# Patient Record
Sex: Female | Born: 1985 | Race: Black or African American | Hispanic: No | Marital: Married | Smoking: Never smoker
Health system: Southern US, Community
[De-identification: ages and names within clinical notes are randomized; demographics above are authoritative.]

## PROBLEM LIST (undated history)

## (undated) DIAGNOSIS — G4700 Insomnia, unspecified: Secondary | ICD-10-CM

## (undated) DIAGNOSIS — I10 Essential (primary) hypertension: Secondary | ICD-10-CM

## (undated) DIAGNOSIS — K2970 Gastritis, unspecified, without bleeding: Secondary | ICD-10-CM

## (undated) DIAGNOSIS — M4800 Spinal stenosis, site unspecified: Secondary | ICD-10-CM

## (undated) DIAGNOSIS — K589 Irritable bowel syndrome without diarrhea: Secondary | ICD-10-CM

## (undated) DIAGNOSIS — F319 Bipolar disorder, unspecified: Secondary | ICD-10-CM

## (undated) DIAGNOSIS — Z309 Encounter for contraceptive management, unspecified: Secondary | ICD-10-CM

## (undated) DIAGNOSIS — J45909 Unspecified asthma, uncomplicated: Secondary | ICD-10-CM

## (undated) DIAGNOSIS — F419 Anxiety disorder, unspecified: Secondary | ICD-10-CM

## (undated) DIAGNOSIS — Z86711 Personal history of pulmonary embolism: Secondary | ICD-10-CM

## (undated) DIAGNOSIS — G4733 Obstructive sleep apnea (adult) (pediatric): Secondary | ICD-10-CM

## (undated) DIAGNOSIS — M1990 Unspecified osteoarthritis, unspecified site: Secondary | ICD-10-CM

## (undated) HISTORY — DX: Morbid (severe) obesity due to excess calories: E66.01

## (undated) HISTORY — PX: TOTAL HIP ARTHROPLASTY: SHX124

## (undated) HISTORY — PX: TOE OSTEOTOMY: SHX1071

## (undated) HISTORY — PX: WISDOM TOOTH EXTRACTION: SHX21

## (undated) HISTORY — PX: SPINAL FUSION: SHX223

## (undated) HISTORY — DX: Obstructive sleep apnea (adult) (pediatric): G47.33

## (undated) HISTORY — PX: OTHER SURGICAL HISTORY: SHX169

## (undated) HISTORY — PX: TUBAL LIGATION: SHX77

## (undated) HISTORY — DX: Spinal stenosis, site unspecified: M48.00

## (undated) HISTORY — DX: Unspecified osteoarthritis, unspecified site: M19.90

## (undated) HISTORY — DX: Personal history of pulmonary embolism: Z86.711

## (undated) HISTORY — DX: Encounter for contraceptive management, unspecified: Z30.9

## (undated) HISTORY — PX: HIP SURGERY: SHX245

## (undated) HISTORY — PX: TOE AMPUTATION: SHX809

---

## 2011-01-07 DIAGNOSIS — L709 Acne, unspecified: Secondary | ICD-10-CM | POA: Insufficient documentation

## 2011-05-06 DIAGNOSIS — M25519 Pain in unspecified shoulder: Secondary | ICD-10-CM | POA: Insufficient documentation

## 2011-08-11 DIAGNOSIS — G4700 Insomnia, unspecified: Secondary | ICD-10-CM | POA: Insufficient documentation

## 2011-08-11 DIAGNOSIS — Z79899 Other long term (current) drug therapy: Secondary | ICD-10-CM | POA: Insufficient documentation

## 2011-08-11 DIAGNOSIS — F419 Anxiety disorder, unspecified: Secondary | ICD-10-CM | POA: Insufficient documentation

## 2018-01-08 DIAGNOSIS — E559 Vitamin D deficiency, unspecified: Secondary | ICD-10-CM | POA: Insufficient documentation

## 2018-03-10 DIAGNOSIS — K580 Irritable bowel syndrome with diarrhea: Secondary | ICD-10-CM | POA: Insufficient documentation

## 2018-09-17 DIAGNOSIS — R490 Dysphonia: Secondary | ICD-10-CM | POA: Insufficient documentation

## 2019-01-08 DIAGNOSIS — I10 Essential (primary) hypertension: Secondary | ICD-10-CM | POA: Insufficient documentation

## 2019-01-08 DIAGNOSIS — F32A Depression, unspecified: Secondary | ICD-10-CM | POA: Insufficient documentation

## 2019-01-08 DIAGNOSIS — J45909 Unspecified asthma, uncomplicated: Secondary | ICD-10-CM | POA: Insufficient documentation

## 2019-01-08 DIAGNOSIS — M1990 Unspecified osteoarthritis, unspecified site: Secondary | ICD-10-CM | POA: Insufficient documentation

## 2019-08-15 ENCOUNTER — Emergency Department: Payer: BLUE CROSS/BLUE SHIELD

## 2019-08-15 ENCOUNTER — Other Ambulatory Visit: Payer: Self-pay

## 2019-08-15 DIAGNOSIS — R0789 Other chest pain: Secondary | ICD-10-CM | POA: Insufficient documentation

## 2019-08-15 DIAGNOSIS — J45909 Unspecified asthma, uncomplicated: Secondary | ICD-10-CM | POA: Diagnosis not present

## 2019-08-15 DIAGNOSIS — I10 Essential (primary) hypertension: Secondary | ICD-10-CM | POA: Diagnosis not present

## 2019-08-15 DIAGNOSIS — Z96649 Presence of unspecified artificial hip joint: Secondary | ICD-10-CM | POA: Diagnosis not present

## 2019-08-15 LAB — CBC
HCT: 33.5 % — ABNORMAL LOW (ref 36.0–46.0)
Hemoglobin: 11.2 g/dL — ABNORMAL LOW (ref 12.0–15.0)
MCH: 28.4 pg (ref 26.0–34.0)
MCHC: 33.4 g/dL (ref 30.0–36.0)
MCV: 84.8 fL (ref 80.0–100.0)
Platelets: 267 K/uL (ref 150–400)
RBC: 3.95 MIL/uL (ref 3.87–5.11)
RDW: 12.8 % (ref 11.5–15.5)
WBC: 6.9 K/uL (ref 4.0–10.5)
nRBC: 0 % (ref 0.0–0.2)

## 2019-08-15 LAB — BASIC METABOLIC PANEL WITH GFR
Anion gap: 9 (ref 5–15)
BUN: 9 mg/dL (ref 6–20)
CO2: 26 mmol/L (ref 22–32)
Calcium: 8.6 mg/dL — ABNORMAL LOW (ref 8.9–10.3)
Chloride: 104 mmol/L (ref 98–111)
Creatinine, Ser: 0.81 mg/dL (ref 0.44–1.00)
GFR calc Af Amer: >60 mL/min
GFR calc non Af Amer: >60 mL/min
Glucose, Bld: 100 mg/dL — ABNORMAL HIGH (ref 70–99)
Potassium: 3 mmol/L — ABNORMAL LOW (ref 3.5–5.1)
Sodium: 139 mmol/L (ref 135–145)

## 2019-08-15 LAB — TROPONIN I (HIGH SENSITIVITY): Troponin I (High Sensitivity): <2 ng/L

## 2019-08-15 NOTE — ED Triage Notes (Addendum)
Pt arrives to ED via POV from home with c/o CP x1hr. Pt reports CP is centralized with radiation into the left side of her chest and into her upper back. Pt reports (+) SHOB; no N/V. Pt denies diaphoresis, but acknowledges feeling "flushed". Pt denies previous cardiac h/x except for HTN. Pt is A&O, in NAD; RR even, regular, and unlabored.

## 2019-08-16 ENCOUNTER — Emergency Department: Payer: BLUE CROSS/BLUE SHIELD

## 2019-08-16 ENCOUNTER — Encounter: Payer: Self-pay | Admitting: Radiology

## 2019-08-16 ENCOUNTER — Other Ambulatory Visit: Payer: Self-pay

## 2019-08-16 ENCOUNTER — Emergency Department
Admission: EM | Admit: 2019-08-16 | Discharge: 2019-08-16 | Disposition: A | Discharge status: Home / Self Care | Payer: BLUE CROSS/BLUE SHIELD | Attending: Emergency Medicine | Admitting: Emergency Medicine

## 2019-08-16 DIAGNOSIS — R079 Chest pain, unspecified: Secondary | ICD-10-CM

## 2019-08-16 HISTORY — DX: Bipolar disorder, unspecified: F31.9

## 2019-08-16 HISTORY — DX: Essential (primary) hypertension: I10

## 2019-08-16 HISTORY — DX: Anxiety disorder, unspecified: F41.9

## 2019-08-16 HISTORY — DX: Gastritis, unspecified, without bleeding: K29.70

## 2019-08-16 HISTORY — DX: Irritable bowel syndrome, unspecified: K58.9

## 2019-08-16 HISTORY — DX: Unspecified asthma, uncomplicated: J45.909

## 2019-08-16 HISTORY — DX: Insomnia, unspecified: G47.00

## 2019-08-16 LAB — TROPONIN I (HIGH SENSITIVITY): Troponin I (High Sensitivity): 3 ng/L (ref <18)

## 2019-08-16 MED ORDER — ONDANSETRON HCL 4 MG/2ML IJ SOLN
4.0000 mg | Freq: Once | INTRAMUSCULAR | Status: AC
  Administered 2019-08-16: 4 mg via INTRAVENOUS
  Filled 2019-08-16: qty 2

## 2019-08-16 MED ORDER — FENTANYL CITRATE (PF) 100 MCG/2ML IJ SOLN
50.0000 ug | Freq: Once | INTRAMUSCULAR | Status: AC
  Administered 2019-08-16: 50 ug via INTRAVENOUS
  Filled 2019-08-16: qty 2

## 2019-08-16 MED ORDER — ASPIRIN 81 MG PO CHEW
324.0000 mg | CHEWABLE_TABLET | Freq: Once | ORAL | Status: AC
  Administered 2019-08-16: 324 mg via ORAL
  Filled 2019-08-16: qty 4

## 2019-08-16 MED ORDER — POTASSIUM CHLORIDE CRYS ER 20 MEQ PO TBCR
40.0000 meq | EXTENDED_RELEASE_TABLET | Freq: Once | ORAL | Status: AC
  Administered 2019-08-16: 40 meq via ORAL
  Filled 2019-08-16: qty 2

## 2019-08-16 MED ORDER — IOHEXOL 350 MG/ML SOLN
100.0000 mL | Freq: Once | INTRAVENOUS | Status: AC | PRN
  Administered 2019-08-16: 100 mL via INTRAVENOUS

## 2019-08-16 NOTE — Discharge Instructions (Signed)

## 2019-08-16 NOTE — ED Provider Notes (Signed)
[facility] Emergency Department Provider Note  ____________________________________________  Time seen: Approximately 1:39 AM  I have reviewed the triage vital signs and the nursing notes.   HISTORY  Chief Complaint Chest Pain   HPI Sharon Hill is a 34 y.o. female with a history of hypertension, elevated BMI, asthma, bipolar disorder, gastritis who presents for evaluation of chest pain.  Patient reports sudden onset of chest pain while driving home from work.  The pain started on the left side of her chest radiating down her left arm, up to her neck in to the left upper back.  She describes the pain as a pulling sensation which is currently 5 out of 10.  She also reports a flushing sensation associated with the onset of the pain.  She used her inhaler twice with no significant relief.  She denies wheezing or cough.  She reports mild shortness of breath associated with the chest pain.  The chest pain has been constant for several hours now.  She reports mild numbness to the left upper arm.  She is compliant with her medications.  She denies fever chills, cough, vomiting or diarrhea, abdominal pain.  No personal or family history of blood clots, no recent travel immobilization, no leg pain or swelling, no hemoptysis or exogenous hormones.   Past Medical History:  Diagnosis Date  . Anxiety   . Asthma   . Bipolar 1 disorder (HCC)   . Gastritis   . Hypertension   . IBS (irritable bowel syndrome)   . Insomnia     Past Surgical History:  Procedure Laterality Date  . left foot toe amputation    . TOTAL HIP ARTHROPLASTY      Prior to Admission medications   Not on File    Allergies Ambien [zolpidem], Morphine and related, Hydrocodone, and Wellbutrin [bupropion]  No family history on file.  Social History Social History   Tobacco Use  . Smoking status: Never Smoker  . Smokeless tobacco: Never Used  Substance Use Topics  . Alcohol use: Not on file    . Drug use: Not on file    Review of Systems  Constitutional: Negative for fever. Eyes: Negative for visual changes. ENT: Negative for sore throat. Neck: No neck pain  Cardiovascular: + chest pain. Respiratory: + shortness of breath. Gastrointestinal: Negative for abdominal pain, vomiting or diarrhea. Genitourinary: Negative for dysuria. Musculoskeletal: Negative for back pain. Skin: Negative for rash. Neurological: Negative for headaches, weakness or numbness. Psych: No SI or HI  ____________________________________________   PHYSICAL EXAM:  VITAL SIGNS: Vitals:   08/16/19 0133 08/16/19 0249  BP: (!) 142/88 (!) 152/82  Pulse: 71 73  Resp: 18 17  Temp:    SpO2: 100% 100%   Constitutional: Alert and oriented. Well appearing and in no apparent distress. HEENT:      Head: Normocephalic and atraumatic.         Eyes: Conjunctivae are normal. Sclera is non-icteric.       Mouth/Throat: Mucous membranes are moist.       Neck: Supple with no signs of meningismus. Cardiovascular: Regular rate and rhythm. No murmurs, gallops, or rubs. 2+ symmetrical distal pulses are present in all extremities. No JVD. Respiratory: Normal respiratory effort. Lungs are clear to auscultation bilaterally. No wheezes, crackles, or rhonchi.  Gastrointestinal: Soft, non tender, and non distended  Musculoskeletal: No edema, cyanosis, or erythema of extremities. Neurologic: Normal speech and language. Face is symmetric. Moving all extremities. No gross focal neurologic deficits  are appreciated. Skin: Skin is warm, dry and intact. No rash noted. Psychiatric: Mood and affect are normal. Speech and behavior are normal.  ____________________________________________   LABS (all labs ordered are listed, but only abnormal results are displayed)  Labs Reviewed  BASIC METABOLIC PANEL - Abnormal; Notable for the following components:      Result Value   Potassium 3.0 (*)    Glucose, Bld 100 (*)     Calcium 8.6 (*)    All other components within normal limits  CBC - Abnormal; Notable for the following components:   Hemoglobin 11.2 (*)    HCT 33.5 (*)    All other components within normal limits  TROPONIN I (HIGH SENSITIVITY)  TROPONIN I (HIGH SENSITIVITY)   ____________________________________________  EKG  ED ECG REPORT I, Rudene Re, the attending physician, personally viewed and interpreted this ECG.  Sinus rhythm, rate of 79, normal intervals, normal axis, no ST elevations or depressions.  Normal EKG.  01:42: Normal sinus rhythm, rate of 66, normal intervals, normal axis, no ST elevations or depressions.  Unchanged from initial EKG. ____________________________________________  RADIOLOGY  I have personally reviewed the images performed during this visit and I agree with the Radiologist's read.   Interpretation by Radiologist:  DG Chest 2 View  Result Date: 08/15/2019 CLINICAL DATA:  Pt arrives to ED via POV from home with c/o CP x1hr. Pt reports CP in centralized with radiation into the left side of her chest and into her upper back. EXAM: CHEST - 2 VIEW COMPARISON:  None. FINDINGS: Normal mediastinal contours. Heart size is upper normal. The lungs are clear. No pneumothorax or pleural effusion. No acute finding in the visualized skeleton. IMPRESSION: No acute cardiopulmonary finding. Electronically Signed   By: Audie Pinto M.D.   On: 08/15/2019 20:17   CT ANGIO CHEST AORTA W/CM & OR WO/CM  Result Date: 08/16/2019 CLINICAL DATA:  Left-sided chest pain radiating to the neck, back and left arm. EXAM: CT ANGIOGRAPHY CHEST WITH CONTRAST TECHNIQUE: Multidetector CT imaging of the chest was performed using the standard protocol during bolus administration of intravenous contrast. Multiplanar CT image reconstructions and MIPs were obtained to evaluate the vascular anatomy. CONTRAST:  125mL OMNIPAQUE IOHEXOL 350 MG/ML SOLN COMPARISON:  None. FINDINGS: Cardiovascular:  Satisfactory opacification of the pulmonary arteries to the segmental level. No evidence of pulmonary embolism. Normal heart size. No pericardial effusion. Mediastinum/Nodes: No enlarged mediastinal, hilar, or axillary lymph nodes. Thyroid gland, trachea, and esophagus demonstrate no significant findings. Lungs/Pleura: Lungs are clear. No pleural effusion or pneumothorax. Upper Abdomen: No acute abnormality. Musculoskeletal: No chest wall abnormality. No acute or significant osseous findings. Review of the MIP images confirms the above findings. IMPRESSION: No CT evidence of pulmonary embolism or other acute intrathoracic process. Electronically Signed   By: Virgina Norfolk M.D.   On: 08/16/2019 02:27     ____________________________________________   PROCEDURES  Procedure(s) performed:yes .1-3 Lead EKG Interpretation Performed by: Rudene Re, MD Authorized by: Rudene Re, MD     Interpretation: normal     ECG rate assessment: normal     Rhythm: sinus rhythm     Ectopy: none     Conduction: normal     Critical Care performed:  None ____________________________________________   INITIAL IMPRESSION / ASSESSMENT AND PLAN / ED COURSE  34 y.o. female with a history of hypertension, elevated BMI, asthma, bipolar disorder, gastritis who presents for evaluation of sudden onset of L sided chest pain associated with SOB and radiating to  the neck and upper back also associated with paresthesias of the L arm. Pain constant for several hours.   Patient initially with severely elevated BP of 193/81 which has improved with no intervention to 142/88, other vital signs are within normal limits, normal work of breathing, normal sats, she looks euvolemic, lungs are clear to auscultation, she has strong pulses in all 4 extremities, no neurological deficits.  Differential diagnoses including ACS versus dissection versus pneumothorax versus PE versus bronchoconstriction versus pneumonia  versus asthma exacerbation versus upper GI spasm versus GERD.  Review of old medical records has been done.  EKG showing no acute ischemic changes.  Second EKG showed no changes. HS-trop x 2 negative.  Chest x-ray visualized by me with normal mediastinum, no pulmonary edema, no pneumothorax, no pneumonia, confirmed by radiology.  No significant electrolyte derangements only mild hypokalemia which was supplemented orally, stable mild anemia. Will get CT angio of the chest. Will give Fentanyl for pain.  Patient placed on telemetry for close cardiorespiratory monitoring.  _________________________ 3:06 AM on 08/16/2019 -----------------------------------------  Reassessment: CTA visualized by me with no evidence of aortic dissection, PE or pneumonia, confirmed by radiology.  Patient symptoms improved.  She was given a full dose aspirin.  Most likely MSK in nature with symptoms lasting for several hours with 2 - EKGs, 2 - high-sensitivity troponin, and a negative CT angiogram.  Blood pressure has improved with no interventions.  Discussed close follow-up with her primary care doctor.  Discussed my standard return precautions for new or worsening chest pain, shortness of breath, fever, or abdominal pain.    _____________________________________________ Please note:  Patient was evaluated in Emergency Department today for the symptoms described in the history of present illness. Patient was evaluated in the context of the global COVID-19 pandemic, which necessitated consideration that the patient might be at risk for infection with the SARS-CoV-2 virus that causes COVID-19. Institutional protocols and algorithms that pertain to the evaluation of patients at risk for COVID-19 are in a state of rapid change based on information released by regulatory bodies including the CDC and federal and state organizations. These policies and algorithms were followed during the patient's care in the ED.  Some ED  evaluations and interventions may be delayed as a result of limited staffing during the pandemic.   [place] Controlled Substance Database was reviewed by me. ____________________________________________   FINAL CLINICAL IMPRESSION(S) / ED DIAGNOSES   Final diagnoses:  Chest pain, unspecified type      NEW MEDICATIONS STARTED DURING THIS VISIT:  ED Discharge Orders    None       Note:  This document was prepared using Dragon voice recognition software and may include unintentional dictation errors.    Don Perking, [place], MD 08/16/19 [phone]

## 2019-08-16 NOTE — ED Notes (Signed)
Pt transported to CT ?

## 2019-11-11 ENCOUNTER — Other Ambulatory Visit: Payer: Self-pay

## 2019-11-11 DIAGNOSIS — J45909 Unspecified asthma, uncomplicated: Secondary | ICD-10-CM | POA: Insufficient documentation

## 2019-11-11 DIAGNOSIS — Y999 Unspecified external cause status: Secondary | ICD-10-CM | POA: Insufficient documentation

## 2019-11-11 DIAGNOSIS — M542 Cervicalgia: Secondary | ICD-10-CM | POA: Insufficient documentation

## 2019-11-11 DIAGNOSIS — Z96643 Presence of artificial hip joint, bilateral: Secondary | ICD-10-CM | POA: Diagnosis not present

## 2019-11-11 DIAGNOSIS — I10 Essential (primary) hypertension: Secondary | ICD-10-CM | POA: Diagnosis not present

## 2019-11-11 DIAGNOSIS — Y93I9 Activity, other involving external motion: Secondary | ICD-10-CM | POA: Insufficient documentation

## 2019-11-11 DIAGNOSIS — Z041 Encounter for examination and observation following transport accident: Secondary | ICD-10-CM | POA: Diagnosis present
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
## 2019-11-12 ENCOUNTER — Emergency Department
Admission: EM | Admit: 2019-11-12 | Discharge: 2019-11-12 | Disposition: A | Discharge status: Home / Self Care | Payer: BC Managed Care – PPO | Attending: Emergency Medicine | Admitting: Emergency Medicine

## 2019-11-12 DIAGNOSIS — M542 Cervicalgia: Secondary | ICD-10-CM

## 2019-11-12 MED ORDER — LIDOCAINE 5 % EX PTCH
1.0000 | MEDICATED_PATCH | CUTANEOUS | Status: DC
  Administered 2019-11-12: 1 via TRANSDERMAL
  Filled 2019-11-12: qty 1

## 2019-11-12 MED ORDER — KETOROLAC TROMETHAMINE 30 MG/ML IJ SOLN
30.0000 mg | Freq: Once | INTRAMUSCULAR | Status: AC
  Administered 2019-11-12: 30 mg via INTRAMUSCULAR
  Filled 2019-11-12: qty 1

## 2019-11-12 MED ORDER — LIDOCAINE 5 % EX PTCH
1.0000 | MEDICATED_PATCH | Freq: Two times a day (BID) | CUTANEOUS | 0 refills | Status: AC
Start: 2019-11-12 — End: 2020-11-11
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
## 2019-11-12 NOTE — ED Notes (Signed)
Reviewed discharge instructions, follow-up care, and prescriptions with patient. Patient verbalized understanding of all information reviewed. Patient stable, with no distress noted at this time.    

## 2020-03-08 DIAGNOSIS — F3181 Bipolar II disorder: Secondary | ICD-10-CM | POA: Insufficient documentation

## 2020-03-23 DIAGNOSIS — R600 Localized edema: Secondary | ICD-10-CM | POA: Insufficient documentation

## 2020-03-23 DIAGNOSIS — D649 Anemia, unspecified: Secondary | ICD-10-CM | POA: Insufficient documentation

## 2020-03-23 DIAGNOSIS — J4540 Moderate persistent asthma, uncomplicated: Secondary | ICD-10-CM | POA: Insufficient documentation

## 2020-03-23 DIAGNOSIS — I2699 Other pulmonary embolism without acute cor pulmonale: Secondary | ICD-10-CM | POA: Insufficient documentation

## 2020-09-05 DIAGNOSIS — M25461 Effusion, right knee: Secondary | ICD-10-CM | POA: Insufficient documentation

## 2020-09-05 DIAGNOSIS — M1711 Unilateral primary osteoarthritis, right knee: Secondary | ICD-10-CM | POA: Insufficient documentation

## 2021-01-08 DIAGNOSIS — F4481 Dissociative identity disorder: Secondary | ICD-10-CM | POA: Insufficient documentation

## 2021-06-25 IMAGING — CT CT ANGIO CHEST
4 of 7 series · 19 of 46 positions shown · IV contrast (omnipaque)
Comparison: None.

CLINICAL DATA: Left-sided chest pain radiating to the neck, back
and left arm.

EXAM:
CT ANGIOGRAPHY CHEST WITH CONTRAST
TECHNIQUE: Multidetector CT imaging of the chest was performed using the
standard protocol during bolus administration of intravenous
contrast. Multiplanar CT image reconstructions and MIPs were
obtained to evaluate the vascular anatomy.
CONTRAST:  100mL OMNIPAQUE IOHEXOL 350 MG/ML SOLN

[Series 4: axial arterial · axial · arterial · 0.76mm/px · z∈[-471,-246]mm · 9 of 95 slices shown]
[im 10/95  lung]
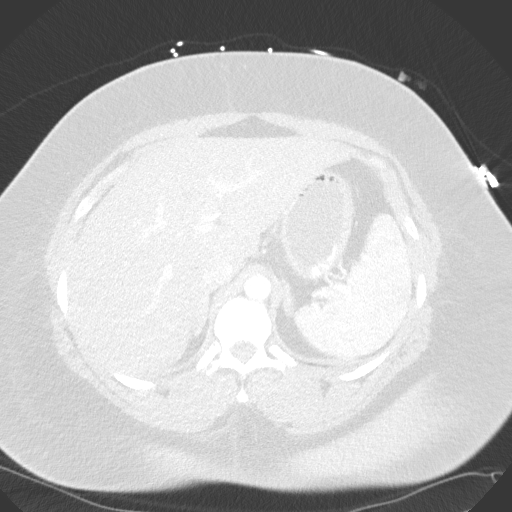
[im 19/95  soft-tissue]
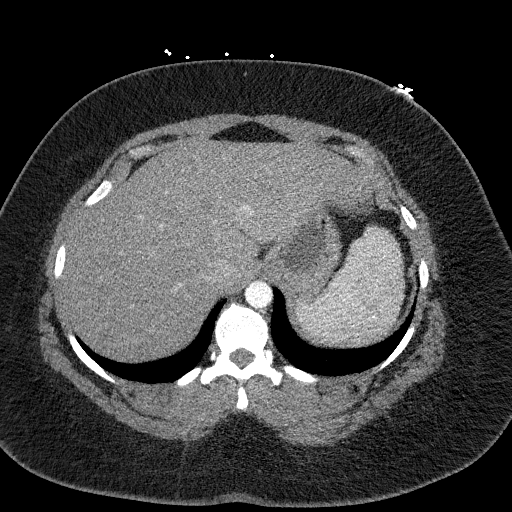
[im 29/95  lung]
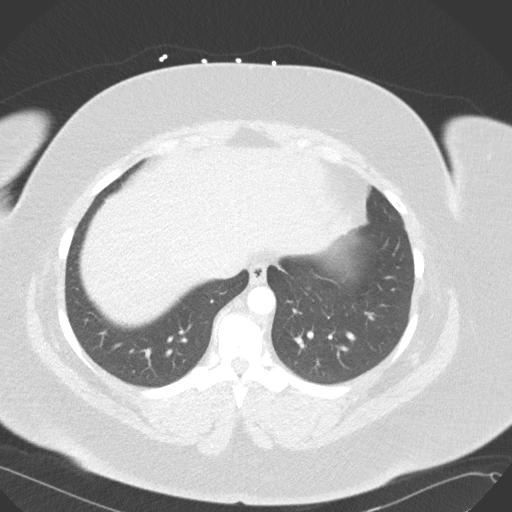
[im 38/95  soft-tissue]
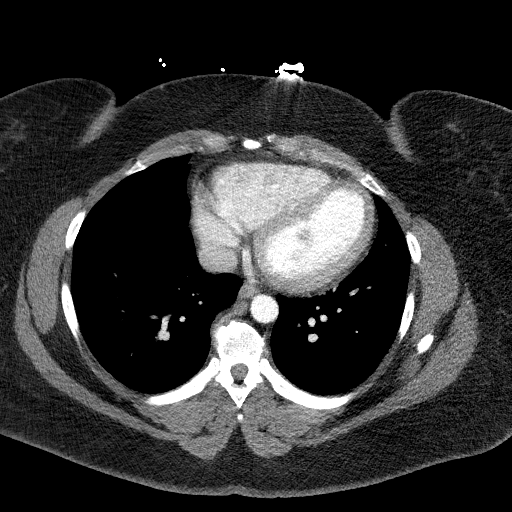
[im 48/95  lung]
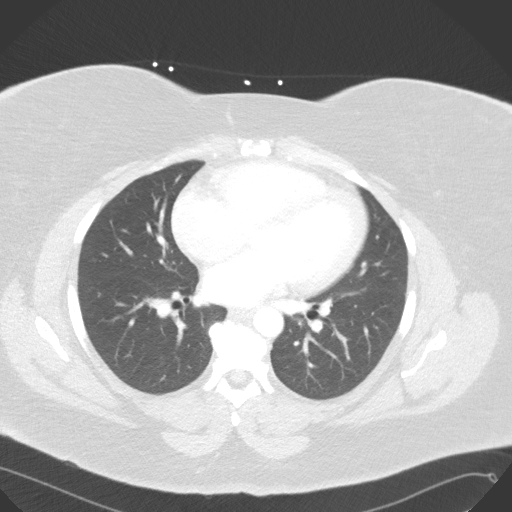
[im 57/95  soft-tissue]
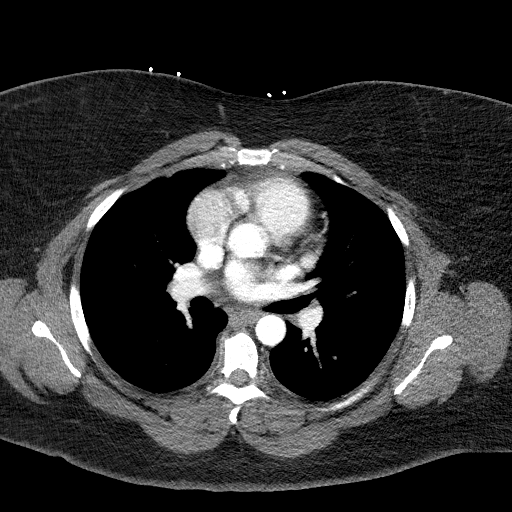
[im 66/95  lung]
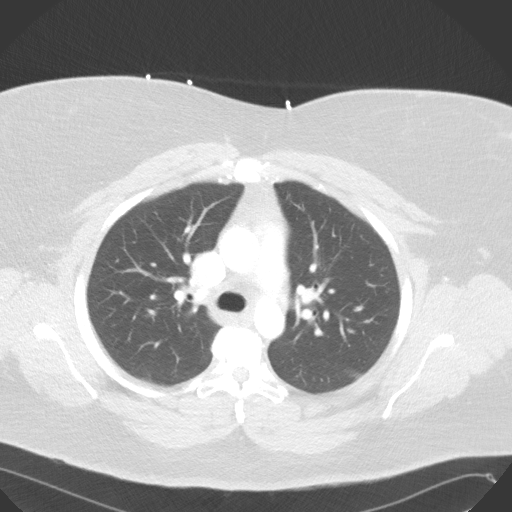
[im 76/95  soft-tissue]
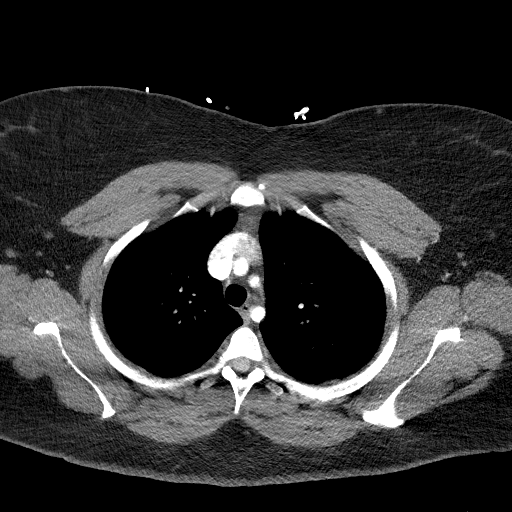
[im 85/95  lung]
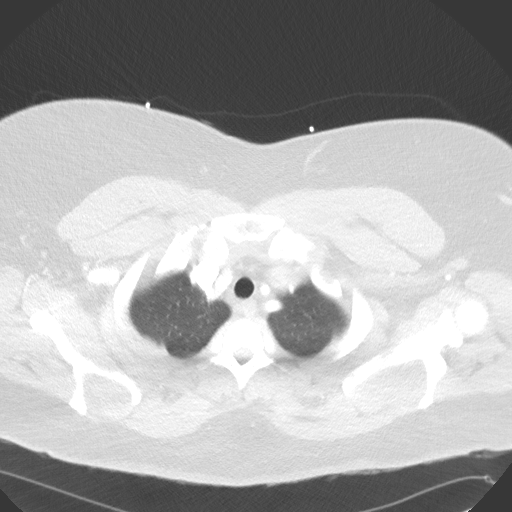

[Series 5: lung · axial · 0.76mm/px · z∈[-478,-398]mm · 3 of 142 slices shown]
[im 11/142  soft-tissue]
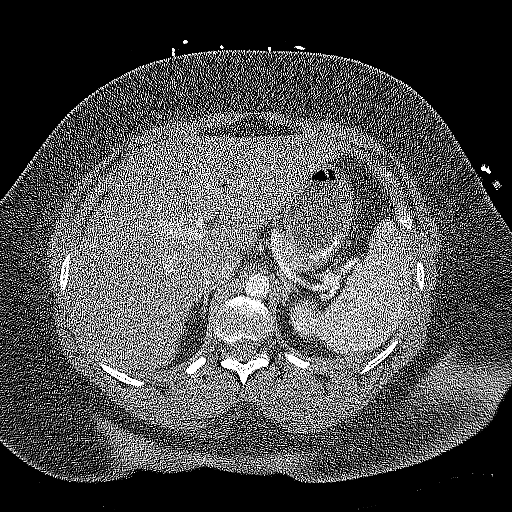
[im 31/142  soft-tissue]
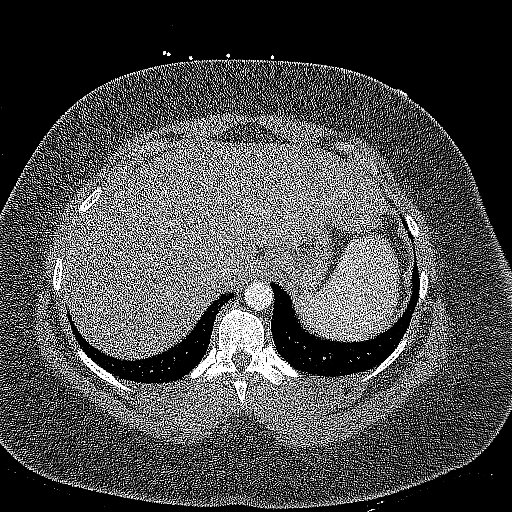
[im 51/142  soft-tissue]
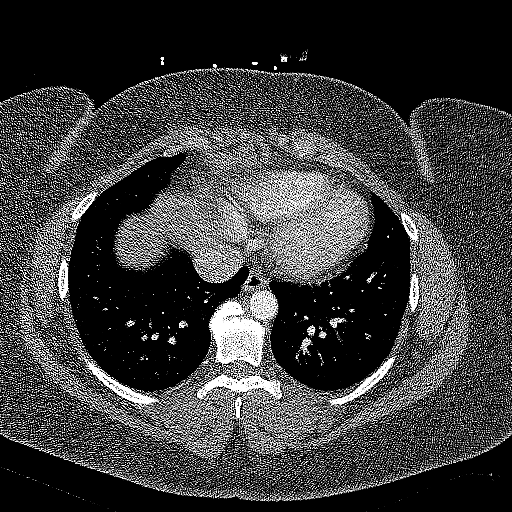

[Series 6: coronals · coronal · 0.60mm/px · 3 of 153 slices shown]
[im 39/153  soft-tissue]
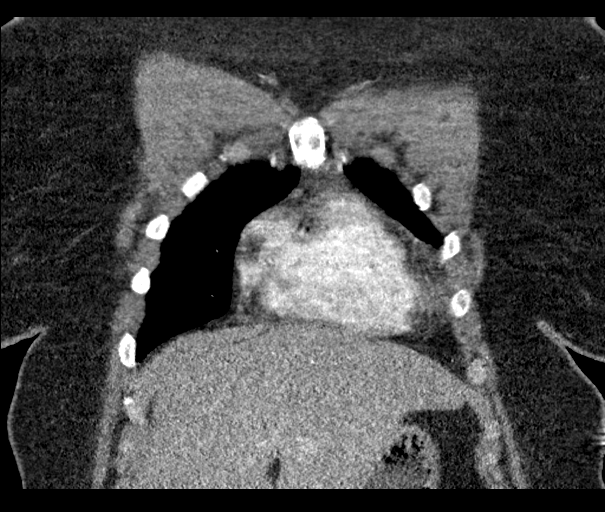
[im 77/153  soft-tissue]
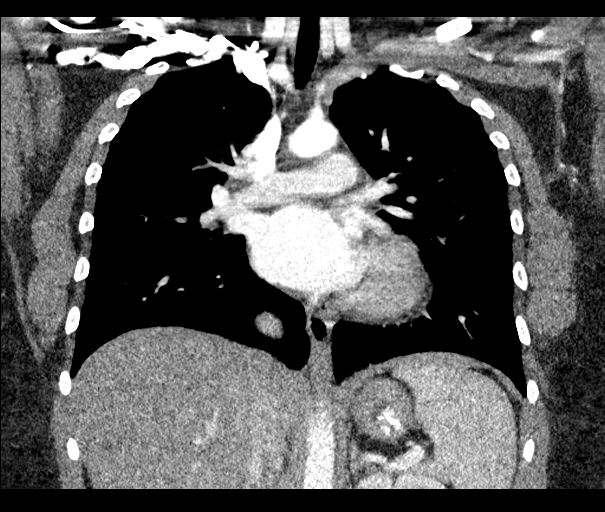
[im 115/153  soft-tissue]
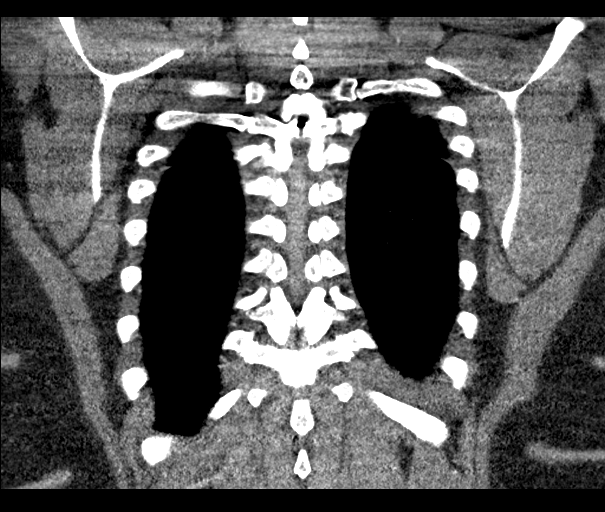

[Series 11: axial pre · axial · non-contrast · 0.75mm/px · z∈[-443,-273]mm · 4 of 58 slices shown]
[im 12/58  lung]
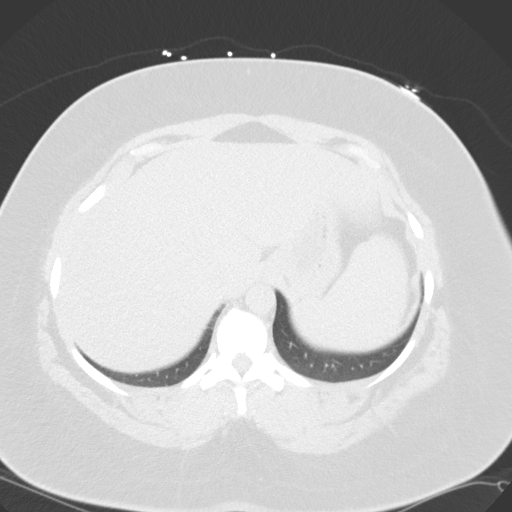
[im 23/58  lung]
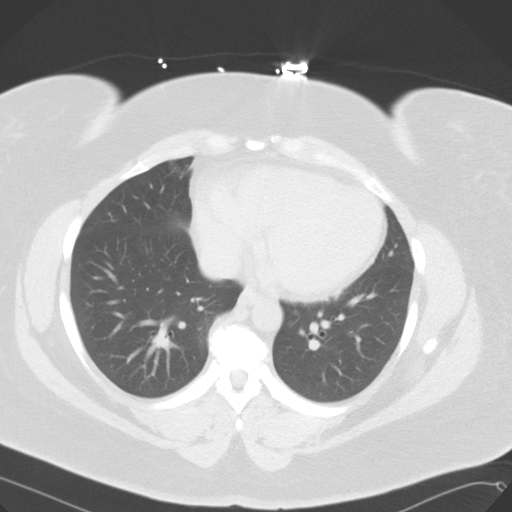
[im 35/58  lung]
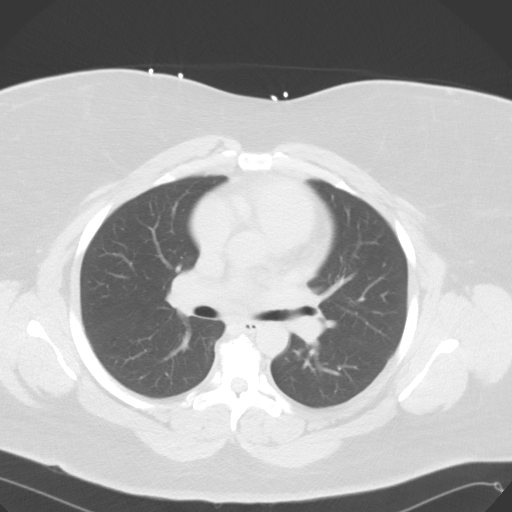
[im 46/58  lung]
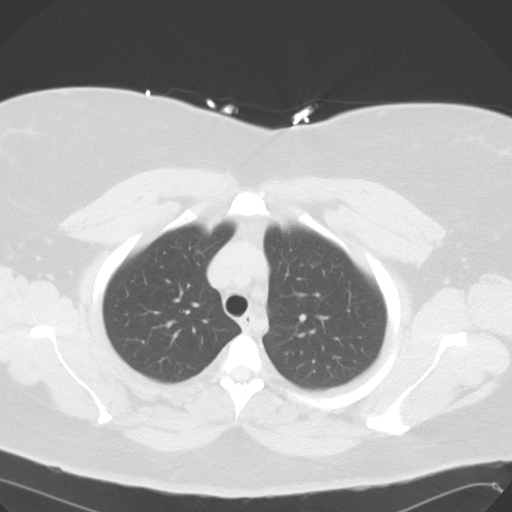

[19 of 46 positions shown; findings below may reference images not displayed]

FINDINGS: Cardiovascular: Satisfactory opacification of the pulmonary arteries
to the segmental level. No evidence of pulmonary embolism. Normal
heart size. No pericardial effusion.

Mediastinum/Nodes: No enlarged mediastinal, hilar, or axillary lymph
nodes. Thyroid gland, trachea, and esophagus demonstrate no
significant findings.

Lungs/Pleura: Lungs are clear. No pleural effusion or pneumothorax.

Upper Abdomen: No acute abnormality.

Musculoskeletal: No chest wall abnormality. No acute or significant
osseous findings.

Review of the MIP images confirms the above findings.
IMPRESSION: No CT evidence of pulmonary embolism or other acute intrathoracic
process.

## 2021-09-06 DIAGNOSIS — R42 Dizziness and giddiness: Secondary | ICD-10-CM | POA: Insufficient documentation

## 2021-09-06 DIAGNOSIS — G4733 Obstructive sleep apnea (adult) (pediatric): Secondary | ICD-10-CM | POA: Insufficient documentation

## 2021-09-06 DIAGNOSIS — F319 Bipolar disorder, unspecified: Secondary | ICD-10-CM | POA: Insufficient documentation

## 2021-09-15 DIAGNOSIS — G935 Compression of brain: Secondary | ICD-10-CM | POA: Insufficient documentation

## 2022-02-02 DIAGNOSIS — K591 Functional diarrhea: Secondary | ICD-10-CM | POA: Insufficient documentation

## 2022-02-02 DIAGNOSIS — K219 Gastro-esophageal reflux disease without esophagitis: Secondary | ICD-10-CM | POA: Insufficient documentation

## 2022-08-13 DIAGNOSIS — F4310 Post-traumatic stress disorder, unspecified: Secondary | ICD-10-CM | POA: Insufficient documentation

## 2022-10-02 DIAGNOSIS — R454 Irritability and anger: Secondary | ICD-10-CM | POA: Insufficient documentation

## 2022-11-13 DIAGNOSIS — Z87898 Personal history of other specified conditions: Secondary | ICD-10-CM | POA: Insufficient documentation

## 2022-11-24 ENCOUNTER — Inpatient Hospital Stay
Admission: RE | Admit: 2022-11-24 | Discharge: 2022-11-24 | Disposition: A | Discharge status: Home / Self Care | Payer: Self-pay | Source: Ambulatory Visit | Attending: Neurosurgery | Admitting: Neurosurgery

## 2022-11-24 ENCOUNTER — Other Ambulatory Visit: Payer: Self-pay

## 2022-11-24 ENCOUNTER — Telehealth: Payer: Self-pay | Admitting: Neurosurgery

## 2022-11-24 DIAGNOSIS — Z049 Encounter for examination and observation for unspecified reason: Secondary | ICD-10-CM

## 2022-11-24 NOTE — Telephone Encounter (Signed)
[facility] sent to [facility]  11/13/22: MRI lumbar  Can ask Dr Katrinka Blazing to review once we receive the lumbar MRI.

## 2022-11-24 NOTE — Telephone Encounter (Signed)
Pt called in wanting a 2nd opinion. She is a current pt at [facility]. Pt states that her doctors there all agree she needs surgery but will not operate due to her size (last recorded BMI that I could see 53.31)  She is calling in to see if any of our providers would be willing to take her on as a pt. She knows she needs to lose weight but is in immense pain and says that in the mean time while she is losing weight she does not want to have to continue taking pain medication. Says she is aware of the risks and complications but just doesn't want to be hooked on pain medication while trying to lose the weight to get her surgery.  I told the patient I would send this message back for our provider's to review and we would get back to her with an answer at our earliest convenience.

## 2022-11-25 NOTE — Telephone Encounter (Signed)
Per Dr. Katrinka Blazing he is Happy to see her, with her strength being intact and stable it is very unlikely that we will offer surgery without her losing weight first.  As long as she understands that we will probably have a similar discussion I do not want her to have to drive all the way into here that if she is not expecting it.  Patient notified and I informed her that we are happy to set up an appointment to establish care if she would like. Patient voiced understanding and will call back if she decides to schedule.

## 2023-03-26 ENCOUNTER — Ambulatory Visit: Payer: Medicaid Other | Admitting: Family Medicine

## 2023-05-15 DIAGNOSIS — M4317 Spondylolisthesis, lumbosacral region: Secondary | ICD-10-CM | POA: Insufficient documentation

## 2023-10-23 ENCOUNTER — Ambulatory Visit (INDEPENDENT_AMBULATORY_CARE_PROVIDER_SITE_OTHER)
Admit: 2023-10-23 | Discharge: 2023-10-23 | Disposition: A | Discharge status: Home / Self Care | Attending: Family Medicine | Admitting: Family Medicine

## 2023-10-23 ENCOUNTER — Other Ambulatory Visit (HOSPITAL_BASED_OUTPATIENT_CLINIC_OR_DEPARTMENT_OTHER): Admitting: Radiology

## 2023-10-23 ENCOUNTER — Ambulatory Visit (HOSPITAL_BASED_OUTPATIENT_CLINIC_OR_DEPARTMENT_OTHER): Admission: EM | Admit: 2023-10-23 | Discharge: 2023-10-23 | Disposition: A | Discharge status: Home / Self Care

## 2023-10-23 ENCOUNTER — Encounter (HOSPITAL_BASED_OUTPATIENT_CLINIC_OR_DEPARTMENT_OTHER): Payer: Self-pay

## 2023-10-23 DIAGNOSIS — M25571 Pain in right ankle and joints of right foot: Secondary | ICD-10-CM | POA: Diagnosis not present

## 2023-10-23 DIAGNOSIS — S98112A Complete traumatic amputation of left great toe, initial encounter: Secondary | ICD-10-CM

## 2023-10-23 DIAGNOSIS — M25471 Effusion, right ankle: Secondary | ICD-10-CM

## 2023-10-23 MED ORDER — PREDNISONE 20 MG PO TABS
20.0000 mg | ORAL_TABLET | Freq: Every day | ORAL | 0 refills | Status: AC
Start: 2023-10-23 — End: 2023-10-28

## 2023-10-23 NOTE — ED Provider Notes (Addendum)
 [facility]    CSN: [id number] Arrival date & time: 10/23/23  0853      History   Chief Complaint Chief Complaint  Patient presents with   Ankle Pain    HPI Sharon Hill is a 38 y.o. female.   Patient has had right ankle pain medially since approximately 10/17/2023.  She denies any injury.  She does report that she has prediabetes but has never had an episode of gout and is not sure what that is.  She has swelling and pain at the right medial ankle and it hurts to walk on the ankle.  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   Ankle Pain Associated symptoms: no back pain and no fever     Past Medical History:  Diagnosis Date   Anxiety    Asthma    Bipolar 1 disorder (HCC)    Gastritis    Hypertension    IBS (irritable bowel syndrome)    Insomnia     There are no active problems to display for this patient.   Past Surgical History:  Procedure Laterality Date   left foot toe amputation     TOTAL HIP ARTHROPLASTY      OB History   No obstetric history on file.      Home Medications    Prior to Admission medications   Medication Sig Start Date End Date Taking? Authorizing Provider  albuterol (VENTOLIN HFA) 108 (90 Base) MCG/ACT inhaler Inhale 2 puffs into the lungs every 4 (four) hours as needed for wheezing. 05/24/19  Yes [provider]  carvedilol (COREG) 12.5 MG tablet Take 12.5 mg by mouth 2 (two) times daily with a meal. 03/13/21  Yes [provider]  Fluticasone-Umeclidin-Vilant 100-62.5-25 MCG/ACT AEPB Inhale 1 puff into the lungs daily. 09/01/19  Yes [provider]  gabapentin (NEURONTIN) 300 MG capsule Take 300 mg by mouth 2 (two) times daily. 09/15/18  Yes [provider]  lithium 600 MG capsule Take 600 mg by mouth 2 (two) times daily with a meal. 08/29/21  Yes [provider]  montelukast (SINGULAIR) 10 MG  tablet Take 10 mg by mouth at bedtime. 09/12/18  Yes [provider]  predniSONE  (DELTASONE ) 20 MG tablet Take 1 tablet (20 mg total) by mouth daily with breakfast for 5 days. 10/23/23 10/28/23 Yes Ival Domino, FNP  QUEtiapine (SEROQUEL) 25 MG tablet Take 25 mg by mouth daily. 03/20/23 12/15/23 Yes [provider]  hyoscyamine (LEVSIN SL) 0.125 MG SL tablet Place 0.125 mg under the tongue every 6 (six) hours as needed for cramping.    [provider]  QUEtiapine (SEROQUEL) 50 MG tablet Take 50 mg by mouth daily.    [provider]    Family History History reviewed. No pertinent family history.  Social History Social History   Tobacco Use   Smoking status: Never   Smokeless tobacco: Never  Vaping Use   Vaping status: Never Used  Substance Use Topics   Alcohol use: Never   Drug use: Never     Allergies   Ambien [zolpidem], Morphine and codeine, Hydrocodone, and Wellbutrin [bupropion]   Review of Systems Review of Systems  Constitutional:  Negative for fever.  Respiratory:  Negative for cough.   Cardiovascular:  Negative for chest pain.  Gastrointestinal:  Negative for abdominal pain, constipation, diarrhea, nausea and vomiting.  Musculoskeletal:  Positive for joint swelling (right ankle pain). Negative for arthralgias and back pain.  Skin:  Negative for color change and rash.  Neurological:  Negative for syncope.  All other systems reviewed and are negative.    Physical Exam Triage Vital Signs ED Triage Vitals  Encounter Vitals Group     BP 10/23/23 0918 (!) 169/118     Girls Systolic BP Percentile --      Girls Diastolic BP Percentile --      Boys Systolic BP Percentile --      Boys Diastolic BP Percentile --      Pulse Rate 10/23/23 0918 (!) 56     Resp --      Temp 10/23/23 0918 97.8 F (36.6 C)     Temp Source 10/23/23 0918 Oral     SpO2 10/23/23 0918 98 %     Weight --      Height --      Head Circumference --      Peak  Flow --      Pain Score 10/23/23 0920 8     Pain Loc --      Pain Education --      Exclude from Growth Chart --    No data found.  Updated Vital Signs BP (!) 169/118 (BP Location: Right Arm)   Pulse (!) 56   Temp 97.8 F (36.6 C) (Oral)   LMP 10/14/2023   SpO2 98%   Visual Acuity Right Eye Distance:   Left Eye Distance:   Bilateral Distance:    Right Eye Near:   Left Eye Near:    Bilateral Near:     Physical Exam Vitals and nursing note reviewed.  Constitutional:      General: She is not in acute distress.    Appearance: She is well-developed. She is obese. She is not ill-appearing or toxic-appearing.  HENT:     Head: Normocephalic and atraumatic.     Right Ear: External ear normal.     Left Ear: External ear normal.     Nose: Nose normal.     Mouth/Throat:     Lips: Pink.     Mouth: Mucous membranes are moist.  Eyes:     Conjunctiva/sclera: Conjunctivae normal.     Pupils: Pupils are equal, round, and reactive to light.  Cardiovascular:     Rate and Rhythm: Normal rate and regular rhythm.     Pulses:          Dorsalis pedis pulses are 2+ on the right side and 2+ on the left side.       Posterior tibial pulses are 2+ on the right side and 2+ on the left side.     Heart sounds: S1 normal and S2 normal. No murmur heard. Pulmonary:     Effort: Pulmonary effort is normal. No respiratory distress.     Breath sounds: Normal breath sounds. No decreased breath sounds, wheezing, rhonchi or rales.  Musculoskeletal:        General: No swelling.     Right lower leg: Swelling present. 2+ Edema present.     Left lower leg: Swelling present. 2+ Edema present.     Right ankle: Swelling present. Tenderness present over the medial malleolus. Decreased range of motion (due to pain).     Right Achilles Tendon: Normal.     Left ankle: Normal.     Left Achilles Tendon: Normal.     Left Lower Extremity: Left leg is amputated below ankle.  Feet:     Comments: Left great toe  amputated when the patient was 38 years old. Skin:    General: Skin is warm and dry.     Capillary Refill: Capillary refill takes less than 2 seconds.     Findings: No rash.  Neurological:     Mental Status: She is alert and oriented to person, place, and time.  Psychiatric:        Mood and Affect: Mood normal.           UC Treatments / Results  Labs (all labs ordered are listed, but only abnormal results are displayed) Labs Reviewed  CBC WITH DIFFERENTIAL/PLATELET  COMPREHENSIVE METABOLIC PANEL WITH GFR  URIC ACID    EKG   Radiology DG Ankle Complete Right Result Date: 10/23/2023 CLINICAL DATA:  RIGHT ankle pain EXAM: RIGHT ANKLE - COMPLETE 3+ VIEW COMPARISON:  None Available. FINDINGS: Ankle mortise intact. The talar dome is normal. No malleolar fracture. The calcaneus is normal. Soft tissue swelling over the medial and lateral malleolus. IMPRESSION: 1. No fracture or dislocation. 2. Soft tissue swelling. Electronically Signed   By: Jackquline Boxer M.D.   On: 10/23/2023 10:27    Procedures Procedures (including critical care time)  Medications Ordered in UC Medications - No data to display  Initial Impression / Assessment and Plan / UC Course  I have reviewed the triage vital signs and the nursing notes.  Pertinent labs & imaging results that were available during my care of the patient were reviewed by me and considered in my medical decision making (see chart for details).  Plan of Care: Right ankle pain: X-rays are negative.  Prednisone  20 mg daily for 5 days.  Blood work is pending to rule in or out gout.  Work excuse provided.  Provided handouts on gout and encouraged to follow low purine diet.  At the end of the visit, after the AVS had printed, determined that an Ace wrap might be additional support for the right ankle.  Ace wrap applied.  Will adjust the plan of care, as needed once the labs result and based on patient's condition.  Follow-up as  needed.  I reviewed the plan of care with the patient and/or the patient's guardian.  The patient and/or guardian had time to ask questions and acknowledged that the questions were answered.  I provided instruction on symptoms or reasons to return here or to go to an ER, if symptoms/condition did not improve, worsened or if new symptoms occurred.  Final Clinical Impressions(s) / UC Diagnoses   Final diagnoses:  Pain and swelling of right ankle  Amputation of left great toe [facility])     Discharge Instructions      Right ankle pain and swelling: This is possibly gout.  Blood work is pending and will adjust the plan of care, if needed once the labs result.  X-ray was normal or negative.  Prednisone  20 mg 1 pill daily for 5 days.  Encouraged RICE therapy.  Follow-up if symptoms do not improve, worsen or new symptoms occur.     ED Prescriptions     Medication Sig Dispense Auth. Provider   predniSONE  (DELTASONE ) 20 MG tablet Take 1 tablet (20 mg total) by mouth daily with breakfast for 5 days. 5 tablet Cimone Fahey, FNP      PDMP not reviewed this encounter.   Ival Domino, FNP 10/23/23 1044    Ival Domino, FNP 10/23/23 1049

## 2023-10-23 NOTE — Discharge Instructions (Addendum)
 Right ankle pain and swelling: This is possibly gout.  Blood work is pending and will adjust the plan of care, if needed once the labs result.  X-ray was normal or negative.  Prednisone  20 mg 1 pill daily for 5 days.  Encouraged RICE therapy.  Follow-up if symptoms do not improve, worsen or new symptoms occur.

## 2023-10-23 NOTE — ED Triage Notes (Signed)
 Right ankle pain x 1 week without injury. States discomfort to medial aspect of right ankle with swelling.

## 2023-10-25 LAB — COMPREHENSIVE METABOLIC PANEL WITH GFR
ALT: 10 IU/L (ref 0–32)
AST: 10 IU/L (ref 0–40)
Albumin: 4.1 g/dL (ref 3.9–4.9)
Alkaline Phosphatase: 103 IU/L (ref 44–121)
BUN/Creatinine Ratio: 11 (ref 9–23)
BUN: 8 mg/dL (ref 6–24)
Bilirubin Total: <0.2 mg/dL (ref 0.0–1.2)
CO2: 22 mmol/L (ref 20–29)
Calcium: 8.8 mg/dL (ref 8.7–10.2)
Chloride: 104 mmol/L (ref 96–106)
Creatinine, Ser: 0.72 mg/dL (ref 0.57–1.00)
Globulin, Total: 2.9 g/dL (ref 1.5–4.5)
Glucose: 103 mg/dL — ABNORMAL HIGH (ref 70–99)
Potassium: 3.9 mmol/L (ref 3.5–5.2)
Sodium: 141 mmol/L (ref 134–144)
Total Protein: 7 g/dL (ref 6.0–8.5)
eGFR: 108 mL/min/1.73 (ref >59)

## 2023-10-25 LAB — CBC WITH DIFFERENTIAL/PLATELET
Basophils Absolute: 0 x10E3/uL (ref 0.0–0.2)
Basos: 1 %
EOS (ABSOLUTE): 0.1 x10E3/uL (ref 0.0–0.4)
Eos: 2 %
Hematocrit: 35.7 % (ref 34.0–46.6)
Hemoglobin: 10.8 g/dL — ABNORMAL LOW (ref 11.1–15.9)
Immature Grans (Abs): 0 x10E3/uL (ref 0.0–0.1)
Immature Granulocytes: 0 %
Lymphocytes Absolute: 1.6 x10E3/uL (ref 0.7–3.1)
Lymphs: 38 %
MCH: 25.9 pg — ABNORMAL LOW (ref 26.6–33.0)
MCHC: 30.3 g/dL — ABNORMAL LOW (ref 31.5–35.7)
MCV: 86 fL (ref 79–97)
Monocytes Absolute: 0.3 x10E3/uL (ref 0.1–0.9)
Monocytes: 8 %
Neutrophils Absolute: 2.1 x10E3/uL (ref 1.4–7.0)
Neutrophils: 50 %
Platelets: 296 x10E3/uL (ref 150–450)
RBC: 4.17 x10E6/uL (ref 3.77–5.28)
RDW: 14.1 % (ref 11.7–15.4)
WBC: 4.2 x10E3/uL (ref 3.4–10.8)

## 2023-10-25 LAB — URIC ACID: Uric Acid: 4.3 mg/dL (ref 2.6–6.2)

## 2023-10-26 ENCOUNTER — Ambulatory Visit (HOSPITAL_BASED_OUTPATIENT_CLINIC_OR_DEPARTMENT_OTHER): Payer: Self-pay | Admitting: Family Medicine

## 2023-10-26 DIAGNOSIS — M5116 Intervertebral disc disorders with radiculopathy, lumbar region: Secondary | ICD-10-CM | POA: Insufficient documentation

## 2023-10-26 DIAGNOSIS — M532X6 Spinal instabilities, lumbar region: Secondary | ICD-10-CM | POA: Insufficient documentation

## 2023-10-26 NOTE — Progress Notes (Signed)
 Uric acid is low normal.  No gout.  CMP your chemistry panel shows a borderline blood glucose but if she was not fasting then it is normal.  Otherwise the chemistry panel is normal with good kidney and liver function.  CBC with differential her blood count shows low hemoglobin and could indicate iron deficiency anemia.  This is not dangerously low but I would recommend a multivitamin with iron and following up with family practice to try to discover why you are hemoglobin is low.  You may need additional testing.  I was not able to reach the patient and speak to her in person but I was able to leave a detailed voicemail.

## 2023-11-18 ENCOUNTER — Encounter (HOSPITAL_BASED_OUTPATIENT_CLINIC_OR_DEPARTMENT_OTHER): Payer: Self-pay

## 2023-11-18 ENCOUNTER — Ambulatory Visit (HOSPITAL_BASED_OUTPATIENT_CLINIC_OR_DEPARTMENT_OTHER)
Admission: EM | Admit: 2023-11-18 | Discharge: 2023-11-18 | Disposition: A | Discharge status: Home / Self Care | Attending: Family Medicine | Admitting: Family Medicine

## 2023-11-18 DIAGNOSIS — M549 Dorsalgia, unspecified: Secondary | ICD-10-CM | POA: Diagnosis not present

## 2023-11-18 DIAGNOSIS — M62830 Muscle spasm of back: Secondary | ICD-10-CM

## 2023-11-18 MED ORDER — CYCLOBENZAPRINE HCL 5 MG PO TABS: 5.0000 mg | ORAL_TABLET | Freq: Every evening | ORAL | 0 refills | Status: AC | PRN

## 2023-11-18 MED ORDER — PREDNISONE 20 MG PO TABS: ORAL_TABLET | ORAL | 0 refills | Status: DC

## 2023-11-18 NOTE — ED Provider Notes (Signed)
 [facility]    CSN: [id number] Arrival date & time: 11/18/23  1647      History   Chief Complaint Chief Complaint  Patient presents with   Back Pain    HPI Sharon Hill is a 38 y.o. female.   38 year old female with chronic spinal stenosis of her cervical spine.  She is seeing orthopedics and will be having surgery at some point in the future.  She has acute right upper back pain over the scapula.  It is radiating to her right upper back and shoulder.  It does not go down the arm.  She has chronic cervical and lumbar spine issues.  She did have some tingling down her right leg yesterday as well.  No loss of bowel or bladder control.  She is walking well.  She is just having pain sometimes as high as 7 or 8 on a 10 point scale.  The right upper back pain started approximately 11/12/2023 or earlier   Back Pain Associated symptoms: no abdominal pain, no chest pain and no fever     Past Medical History:  Diagnosis Date   Anxiety    Asthma    Bipolar 1 disorder (HCC)    Gastritis    Hypertension    IBS (irritable bowel syndrome)    Insomnia     There are no active problems to display for this patient.   Past Surgical History:  Procedure Laterality Date   left foot toe amputation     TOTAL HIP ARTHROPLASTY      OB History   No obstetric history on file.      Home Medications    Prior to Admission medications   Medication Sig Start Date End Date Taking? Authorizing Provider  cyclobenzaprine  (FLEXERIL ) 5 MG tablet Take 1 tablet (5 mg total) by mouth at bedtime as needed for up to 15 days for muscle spasms. 11/18/23 12/03/23 Yes Ival Domino, FNP  predniSONE  (DELTASONE ) 20 MG tablet Take 20 mg, #2 pills (40 mg dose) daily x 3 days then 20 mg, #1 pill (20 mg dose) daily x 3 days. 11/18/23  Yes Ival Domino, FNP  albuterol (VENTOLIN HFA) 108 (90 Base) MCG/ACT inhaler Inhale 2 puffs into the lungs every 4 (four) hours as needed for wheezing. 05/24/19   [provider]  carvedilol (COREG) 12.5 MG tablet Take 12.5 mg by mouth 2 (two) times daily with a meal. 03/13/21   [provider]  Fluticasone-Umeclidin-Vilant 100-62.5-25 MCG/ACT AEPB Inhale 1 puff into the lungs daily. 09/01/19   [provider]  gabapentin (NEURONTIN) 300 MG capsule Take 300 mg by mouth 2 (two) times daily. 09/15/18   [provider]  hyoscyamine (LEVSIN SL) 0.125 MG SL tablet Place 0.125 mg under the tongue every 6 (six) hours as needed for cramping.    [provider]  lithium 600 MG capsule Take 600 mg by mouth 2 (two) times daily with a meal. 08/29/21   [provider]  montelukast (SINGULAIR) 10 MG tablet Take 10 mg by mouth at bedtime. 09/12/18   [provider]  QUEtiapine (SEROQUEL) 25 MG tablet Take 25 mg by mouth daily. 03/20/23 12/15/23  [provider]  QUEtiapine (SEROQUEL) 50 MG tablet Take 50 mg by mouth daily.    [provider]    Family History History reviewed. No pertinent family history.  Social History Social History   Tobacco Use   Smoking status: Never   Smokeless tobacco: Never  Vaping  Use   Vaping status: Never Used  Substance Use Topics   Alcohol use: Never   Drug use: Never     Allergies   Ambien [zolpidem], Morphine and codeine, Hydrocodone, and Wellbutrin [bupropion]   Review of Systems Review of Systems  Constitutional:  Negative for fever.  Respiratory:  Negative for cough.   Cardiovascular:  Negative for chest pain.  Gastrointestinal:  Negative for abdominal pain, constipation, diarrhea, nausea and vomiting.  Musculoskeletal:  Positive for back pain. Negative for arthralgias.  Skin:  Negative for color change and rash.  Neurological:  Negative for syncope.  All other systems reviewed and are negative.    Physical Exam Triage Vital Signs ED Triage Vitals  Encounter Vitals Group     BP 11/18/23 1736 (!) 170/78     Girls Systolic BP Percentile --       Girls Diastolic BP Percentile --      Boys Systolic BP Percentile --      Boys Diastolic BP Percentile --      Pulse Rate 11/18/23 1736 (!) 58     Resp 11/18/23 1736 20     Temp 11/18/23 1736 98.1 F (36.7 C)     Temp Source 11/18/23 1736 Oral     SpO2 11/18/23 1736 97 %     Weight --      Height --      Head Circumference --      Peak Flow --      Pain Score 11/18/23 1737 8     Pain Loc --      Pain Education --      Exclude from Growth Chart --    No data found.  Updated Vital Signs BP (!) 170/78 (BP Location: Right Arm)   Pulse (!) 58   Temp 98.1 F (36.7 C) (Oral)   Resp 20   LMP 11/13/2023   SpO2 97%   Visual Acuity Right Eye Distance:   Left Eye Distance:   Bilateral Distance:    Right Eye Near:   Left Eye Near:    Bilateral Near:     Physical Exam Vitals and nursing note reviewed.  Constitutional:      General: She is not in acute distress.    Appearance: She is well-developed. She is not ill-appearing or toxic-appearing.  HENT:     Head: Normocephalic and atraumatic.     Right Ear: External ear normal.     Left Ear: External ear normal.     Nose: Nose normal.     Mouth/Throat:     Lips: Pink.     Mouth: Mucous membranes are moist.  Eyes:     Conjunctiva/sclera: Conjunctivae normal.     Pupils: Pupils are equal, round, and reactive to light.  Cardiovascular:     Rate and Rhythm: Normal rate and regular rhythm.     Heart sounds: S1 normal and S2 normal. No murmur heard. Pulmonary:     Effort: Pulmonary effort is normal. No respiratory distress.     Breath sounds: Normal breath sounds. No decreased breath sounds, wheezing, rhonchi or rales.  Musculoskeletal:        General: No swelling.     Right shoulder: Normal.     Left shoulder: Normal.     Right upper arm: Normal.     Left upper arm: Normal.     Cervical back: Spasms (Right upper back, below the trapezius muscle and along and above the scapula), tenderness (Along the cervical spine  and  upper thoracic spinal column and across the right upper back towards the scapula.) and bony tenderness (Over the cervical and thoracic spinal column and over the scapula.) present. No swelling, edema, deformity, erythema, signs of trauma, lacerations, rigidity, torticollis or crepitus. Pain with movement (Pain with movement of the upper back and the right shoulder.) present. Decreased range of motion (Due to pain).     Thoracic back: Spasms (Upper thoracic area and radiates to the right shoulder) and tenderness (Upper thoracic area and radiates to the right shoulder) present.  Skin:    General: Skin is warm and dry.     Capillary Refill: Capillary refill takes less than 2 seconds.     Findings: No rash.  Neurological:     Mental Status: She is alert and oriented to person, place, and time.  Psychiatric:        Mood and Affect: Mood normal.      UC Treatments / Results  Labs (all labs ordered are listed, but only abnormal results are displayed) Labs Reviewed - No data to display  EKG   Radiology No results found.  Procedures Procedures (including critical care time)  Medications Ordered in UC Medications - No data to display  Initial Impression / Assessment and Plan / UC Course  I have reviewed the triage vital signs and the nursing notes.  Pertinent labs & imaging results that were available during my care of the patient were reviewed by me and considered in my medical decision making (see chart for details).  Plan of Care: Upper back pain and muscle spasms of right upper back and over the scapula: Encouraged ice and heat therapy.  Cyclobenzaprine  5 mg after evening meal for muscle spasms.  Do not use and drive.  Prednisone  20 mg, #2 pills (40 mg dose) daily x 3 days then 20 mg, #1 pill (20 mg dose) daily x 3 days.  Discouraged use of NSAIDs while on prednisone .  See discharge instructions for more patient education.  Follow-up if symptoms do not improve, worsen or new symptoms  occur.  I reviewed the plan of care with the patient and/or the patient's guardian.  The patient and/or guardian had time to ask questions and acknowledged that the questions were answered.  I provided instruction on symptoms or reasons to return here or to go to an ER, if symptoms/condition did not improve, worsened or if new symptoms occurred.  Final Clinical Impressions(s) / UC Diagnoses   Final diagnoses:  Upper back pain on right side  Muscle spasm of back     Discharge Instructions      Upper back pain radiating to the right scapula with muscle spasms: Cyclobenzaprine  5 mg after evening meal for muscle spasms.  Do not use and drive.  Prednisone  20 mg, #2 pills (40 mg dose) daily x 3 days then 20 mg, #1 pill (20 mg dose) daily x 3 days.  Encouraged ice and heat therapy.  Do not use Advil, Motrin, Ibuprofen, Aleve, Naproxen Sodium (NSAIDS) for 1 to 2 weeks during/after using oral steroids or after a steroid injection.  May use Tylenol/Acetaminophen, 500 mg,1-1.5 pills, every 6 hours as needed for pain.   Follow-up with orthopedist.  Return here if needed.     ED Prescriptions     Medication Sig Dispense Auth. Provider   cyclobenzaprine  (FLEXERIL ) 5 MG tablet Take 1 tablet (5 mg total) by mouth at bedtime as needed for up to 15 days for muscle spasms. 15 tablet Satoria Dunlop,  Lauraine, FNP   predniSONE  (DELTASONE ) 20 MG tablet Take 20 mg, #2 pills (40 mg dose) daily x 3 days then 20 mg, #1 pill (20 mg dose) daily x 3 days. 9 tablet Kolleen Ochsner, FNP      PDMP not reviewed this encounter.   Ival Lauraine, FNP 11/18/23 [phone]

## 2023-11-18 NOTE — ED Triage Notes (Signed)
 Patient has hx of spinal stenosis. Onset of upper spine pain with radiation to right shoulder. No radiation of pain down right arm. Has lower back problems and onset of numbness/tingling down right leg yesterday as well.  No loss of bowel or bladder control. Able to ambulate without assistance.

## 2023-11-18 NOTE — Discharge Instructions (Signed)
 Upper back pain radiating to the right scapula with muscle spasms: Cyclobenzaprine  5 mg after evening meal for muscle spasms.  Do not use and drive.  Prednisone  20 mg, #2 pills (40 mg dose) daily x 3 days then 20 mg, #1 pill (20 mg dose) daily x 3 days.  Encouraged ice and heat therapy.  Do not use Advil, Motrin, Ibuprofen, Aleve, Naproxen Sodium (NSAIDS) for 1 to 2 weeks during/after using oral steroids or after a steroid injection.  May use Tylenol/Acetaminophen, 500 mg,1-1.5 pills, every 6 hours as needed for pain.   Follow-up with orthopedist.  Return here if needed.

## 2023-11-20 ENCOUNTER — Ambulatory Visit (HOSPITAL_BASED_OUTPATIENT_CLINIC_OR_DEPARTMENT_OTHER): Admitting: Family Medicine

## 2023-11-20 ENCOUNTER — Encounter (HOSPITAL_BASED_OUTPATIENT_CLINIC_OR_DEPARTMENT_OTHER): Payer: Self-pay | Admitting: Family Medicine

## 2023-11-20 ENCOUNTER — Other Ambulatory Visit (HOSPITAL_BASED_OUTPATIENT_CLINIC_OR_DEPARTMENT_OTHER): Payer: Self-pay

## 2023-11-20 ENCOUNTER — Other Ambulatory Visit (HOSPITAL_BASED_OUTPATIENT_CLINIC_OR_DEPARTMENT_OTHER): Payer: Self-pay | Admitting: Family Medicine

## 2023-11-20 VITALS — BP 174/92 | HR 69 | Temp 98.3°F | Resp 16 | Ht 67.72 in | Wt 323.4 lb

## 2023-11-20 DIAGNOSIS — M5030 Other cervical disc degeneration, unspecified cervical region: Secondary | ICD-10-CM | POA: Insufficient documentation

## 2023-11-20 DIAGNOSIS — R5382 Chronic fatigue, unspecified: Secondary | ICD-10-CM | POA: Diagnosis not present

## 2023-11-20 DIAGNOSIS — I10 Essential (primary) hypertension: Secondary | ICD-10-CM | POA: Diagnosis not present

## 2023-11-20 DIAGNOSIS — E785 Hyperlipidemia, unspecified: Secondary | ICD-10-CM

## 2023-11-20 DIAGNOSIS — Z86711 Personal history of pulmonary embolism: Secondary | ICD-10-CM

## 2023-11-20 DIAGNOSIS — G4733 Obstructive sleep apnea (adult) (pediatric): Secondary | ICD-10-CM

## 2023-11-20 DIAGNOSIS — Z309 Encounter for contraceptive management, unspecified: Secondary | ICD-10-CM

## 2023-11-20 MED ORDER — AMLODIPINE BESYLATE 5 MG PO TABS
5.0000 mg | ORAL_TABLET | Freq: Every day | ORAL | 1 refills | Status: AC
  Filled 2023-11-20 – 2023-12-08 (×2): qty 30, 30d supply, fill #0

## 2023-11-20 NOTE — Assessment & Plan Note (Addendum)
 Stable but obvious concern that her BP is persistently elevated.  She feels its controlled and accurate at home.  Await labs and f/u with her soon.  Consider another GLP-1.  Request and review old records.  Need to clarify if she really needs chronic anticoagulation.  Reevaluate her BP soon.  She requests referral to local sleep specialist and a female GYN provider.

## 2023-11-20 NOTE — Progress Notes (Signed)
 Established Patient Office Visit  Subjective   Patient ID: Sharon Hill, female    DOB: [birth year]  Age: 38 y.o. MRN: [id number]  Chief Complaint  Patient presents with   Establish Care    Establish care    Weight Management Screening    Medication refill     F/u as above.  New to my practice.  Needs a refill and wishes to discuss weight loss options.  Has already had dietary counseling and understands the importance of exercise.  Unable to tolerate Wegovy due to nausea.  Hasn't tried any other GLP-1 options.    Past Medical History:  Diagnosis Date   Anxiety    Asthma    Moderate   Bipolar 1 disorder (HCC)    f/by Psychiatry   History of pulmonary embolism    circa 2021, with reportedly negative coagulopathy workup   Hypertension    IBS (irritable bowel syndrome)    Insomnia    Morbid obesity (HCC)    OSA (obstructive sleep apnea)    with CPAP declined for now   Osteoarthritis    Spinal stenosis    f/by [facility]surgery    Outpatient Encounter Medications as of 11/20/2023  Medication Sig   albuterol (VENTOLIN HFA) 108 (90 Base) MCG/ACT inhaler Inhale 2 puffs into the lungs every 4 (four) hours as needed for wheezing.   carvedilol (COREG) 12.5 MG tablet Take 12.5 mg by mouth 2 (two) times daily with a meal.   cyanocobalamin 1000 MCG tablet Take 1,000 mcg by mouth.   cyclobenzaprine  (FLEXERIL ) 5 MG tablet Take 1 tablet (5 mg total) by mouth at bedtime as needed for up to 15 days for muscle spasms.   diphenhydrAMINE (BENADRYL) 25 mg capsule Take 25-50 mg by mouth.   ergocalciferol (VITAMIN D2) 1.25 MG (50000 UT) capsule Oral   fluticasone (FLONASE) 50 MCG/ACT nasal spray Place 2 sprays into the nose.   fluticasone (FLONASE) 50 MCG/ACT nasal spray 1 spray.   Fluticasone-Umeclidin-Vilant 100-62.5-25 MCG/ACT AEPB Inhale 1 puff into the lungs daily.   gabapentin (NEURONTIN) 300 MG capsule Take 300 mg by mouth 2 (two) times daily.   hyoscyamine (LEVSIN SL) 0.125 MG SL  tablet Place 0.125 mg under the tongue every 6 (six) hours as needed for cramping.   lithium 600 MG capsule Take 600 mg by mouth 2 (two) times daily with a meal.   montelukast (SINGULAIR) 10 MG tablet Take 10 mg by mouth at bedtime.   ondansetron  (ZOFRAN -ODT) 4 MG disintegrating tablet    predniSONE  (DELTASONE ) 20 MG tablet Take 20 mg, #2 pills (40 mg dose) daily x 3 days then 20 mg, #1 pill (20 mg dose) daily x 3 days.   promethazine (PHENERGAN) 25 MG tablet Take 25 mg by mouth.   QUEtiapine (SEROQUEL) 25 MG tablet Take 25 mg by mouth daily.   QUEtiapine (SEROQUEL) 50 MG tablet Take 50 mg by mouth daily.   rivaroxaban (XARELTO) 10 MG TABS tablet Take 10 mg by mouth.   triamterene-hydrochlorothiazide (MAXZIDE-25) 37.5-25 MG tablet Take 1 tablet by mouth.   No facility-administered encounter medications on file as of 11/20/2023.    Social History   Tobacco Use   Smoking status: Never   Smokeless tobacco: Never  Vaping Use   Vaping status: Never Used  Substance Use Topics   Alcohol use: Never   Drug use: Never      Review of Systems  Constitutional:  Negative for diaphoresis, fever, malaise/fatigue and weight loss.  Respiratory:  Negative for cough, shortness of breath and wheezing.   Cardiovascular:  Negative for chest pain, palpitations, orthopnea, claudication, leg swelling and PND.      Objective:     BP (!) 174/92 (BP Location: Right Arm, Patient Position: Standing, Cuff Size: Large)   Pulse 69   Temp 98.3 F (36.8 C) (Oral)   Resp 16   Ht 5' 7.72 (1.72 m)   Wt (!) 323 lb 6.4 oz (146.7 kg)   LMP 11/12/2023 (Approximate)   SpO2 97%   BMI 49.58 kg/m    Physical Exam Constitutional:      General: She is not in acute distress.    Appearance: Normal appearance. She is obese.  HENT:     Head: Normocephalic.  Neck:     Vascular: No carotid bruit.  Cardiovascular:     Rate and Rhythm: Normal rate and regular rhythm.     Pulses: Normal pulses.     Heart sounds:  Normal heart sounds.  Pulmonary:     Effort: Pulmonary effort is normal.     Breath sounds: Normal breath sounds.  Abdominal:     General: Bowel sounds are normal.     Palpations: Abdomen is soft.  Musculoskeletal:     Cervical back: Neck supple. No tenderness.     Right lower leg: Edema present.     Left lower leg: Edema present.     Comments: Chronic 1+ bilateral edema  Neurological:     Mental Status: She is alert.  Psychiatric:        Mood and Affect: Mood normal.        Thought Content: Thought content normal.     Comments: Very pleasant affect      No results found for any visits on 11/20/23.    The 10-year ASCVD risk score (Arnett DK, et al., 2019) is: 5%    Assessment & Plan:  Primary hypertension Assessment & Plan: Stable but obvious concern that her BP is persistently elevated.  She feels its controlled and accurate at home.  Await labs and f/u with her soon.  Consider another GLP-1.  Request and review old records.  Need to clarify if she really needs chronic anticoagulation.  Reevaluate her BP soon.  She requests referral to local sleep specialist and a female GYN provider.  Orders: -     Comprehensive metabolic panel with GFR -     CBC with Differential/Platelet  Chronic fatigue -     TSH -     Vitamin B12  Dyslipidemia -     Lipid panel  History of pulmonary embolism -     Ambulatory referral to Hematology / Oncology  Morbid obesity (HCC)  OSA (obstructive sleep apnea) -     Ambulatory referral to Sleep Studies  Encounter for contraceptive management, unspecified type -     Ambulatory referral to Gynecology    Return in about 4 weeks (around 12/18/2023) for chronic follow-up.    REDDING PONCE NORLEEN FALCON., MD

## 2023-11-21 LAB — CBC WITH DIFFERENTIAL/PLATELET
Basophils Absolute: 0 x10E3/uL (ref 0.0–0.2)
Basos: 1 %
EOS (ABSOLUTE): 0.1 x10E3/uL (ref 0.0–0.4)
Eos: 1 %
Hematocrit: 36.6 % (ref 34.0–46.6)
Hemoglobin: 11.3 g/dL (ref 11.1–15.9)
Immature Grans (Abs): 0 x10E3/uL (ref 0.0–0.1)
Immature Granulocytes: 0 %
Lymphocytes Absolute: 1.8 x10E3/uL (ref 0.7–3.1)
Lymphs: 36 %
MCH: 26 pg — ABNORMAL LOW (ref 26.6–33.0)
MCHC: 30.9 g/dL — ABNORMAL LOW (ref 31.5–35.7)
MCV: 84 fL (ref 79–97)
Monocytes Absolute: 0.3 x10E3/uL (ref 0.1–0.9)
Monocytes: 6 %
Neutrophils Absolute: 2.8 x10E3/uL (ref 1.4–7.0)
Neutrophils: 56 %
Platelets: 307 x10E3/uL (ref 150–450)
RBC: 4.34 x10E6/uL (ref 3.77–5.28)
RDW: 14.5 % (ref 11.7–15.4)
WBC: 5.1 x10E3/uL (ref 3.4–10.8)

## 2023-11-21 LAB — LIPID PANEL
Chol/HDL Ratio: 5.5 ratio — ABNORMAL HIGH (ref 0.0–4.4)
Cholesterol, Total: 186 mg/dL (ref 100–199)
HDL: 34 mg/dL — ABNORMAL LOW (ref >39)
LDL Chol Calc (NIH): 136 mg/dL — ABNORMAL HIGH (ref 0–99)
Triglycerides: 89 mg/dL (ref 0–149)
VLDL Cholesterol Cal: 16 mg/dL (ref 5–40)

## 2023-11-21 LAB — COMPREHENSIVE METABOLIC PANEL WITH GFR
ALT: 10 IU/L (ref 0–32)
AST: 7 IU/L (ref 0–40)
Albumin: 4.2 g/dL (ref 3.9–4.9)
Alkaline Phosphatase: 77 IU/L (ref 44–121)
BUN/Creatinine Ratio: 8 — ABNORMAL LOW (ref 9–23)
BUN: 6 mg/dL (ref 6–24)
Bilirubin Total: 0.3 mg/dL (ref 0.0–1.2)
CO2: 21 mmol/L (ref 20–29)
Calcium: 9.1 mg/dL (ref 8.7–10.2)
Chloride: 105 mmol/L (ref 96–106)
Creatinine, Ser: 0.73 mg/dL (ref 0.57–1.00)
Globulin, Total: 2.6 g/dL (ref 1.5–4.5)
Glucose: 103 mg/dL — ABNORMAL HIGH (ref 70–99)
Potassium: 4.4 mmol/L (ref 3.5–5.2)
Sodium: 141 mmol/L (ref 134–144)
Total Protein: 6.8 g/dL (ref 6.0–8.5)
eGFR: 107 mL/min/1.73 (ref >59)

## 2023-11-21 LAB — TSH: TSH: 1.26 u[IU]/mL (ref 0.450–4.500)

## 2023-11-21 LAB — VITAMIN B12: Vitamin B-12: 216 pg/mL — ABNORMAL LOW (ref 232–1245)

## 2023-11-23 ENCOUNTER — Ambulatory Visit (HOSPITAL_BASED_OUTPATIENT_CLINIC_OR_DEPARTMENT_OTHER): Payer: Self-pay | Admitting: Family Medicine

## 2023-11-25 ENCOUNTER — Encounter (HOSPITAL_BASED_OUTPATIENT_CLINIC_OR_DEPARTMENT_OTHER): Payer: Self-pay | Admitting: Family Medicine

## 2023-11-30 ENCOUNTER — Other Ambulatory Visit (HOSPITAL_BASED_OUTPATIENT_CLINIC_OR_DEPARTMENT_OTHER): Payer: Self-pay

## 2023-12-01 ENCOUNTER — Telehealth (HOSPITAL_BASED_OUTPATIENT_CLINIC_OR_DEPARTMENT_OTHER): Payer: Self-pay | Admitting: Family Medicine

## 2023-12-01 ENCOUNTER — Other Ambulatory Visit (HOSPITAL_BASED_OUTPATIENT_CLINIC_OR_DEPARTMENT_OTHER): Payer: Self-pay | Admitting: Family Medicine

## 2023-12-01 NOTE — Telephone Encounter (Signed)
 Copied from CRM [id number]. Topic: Appointments - Scheduling Inquiry for Clinic >> Dec 01, 2023 12:56 PM Fonda T wrote: Reason for CRM: Received call requesting to schedule multiple B12 injections, per KMS, sending CRM for B12 injections to be scheduled.  Patient can be reached at [phone] to arrange appointments.

## 2023-12-01 NOTE — Telephone Encounter (Signed)
 Copied from CRM [id number]. Topic: Clinical - Medication Refill >> Dec 01, 2023 12:59 PM Fonda T wrote: Medication: Albuterol (VENTOLIN HFA) 108 (90 Base) MCG/ACT inhaler  hyoscyamine (LEVSIN SL) 0.125 MG SL tablet  carvedilol (COREG) 12.5 MG tablet  gabapentin (NEURONTIN) 300 MG capsule  triamterene-hydrochlorothiazide (MAXZIDE-25) 37.5-25 MG tablet  ondansetron  (ZOFRAN -ODT) 4 MG disintegrating tablet  promethazine (PHENERGAN) 25 MG tablet  Trelegy Ellipta 100-62.5  Has the patient contacted their pharmacy? Yes, advised to contact office for refill request  This is the patient's preferred pharmacy:   [facility] [address], [address] [place] [address] Phone: [phone] Fax: [phone]     Is this the correct pharmacy for this prescription? Yes If no, delete pharmacy and type the correct one.   Has the prescription been filled recently? Yes  Is the patient out of the medication? Yes  Has the patient been seen for an appointment in the last year OR does the patient have an upcoming appointment? Yes  Can we respond through MyChart? Yes  Agent: Please be advised that Rx refills may take up to 3 business days. We ask that you follow-up with your pharmacy.

## 2023-12-01 NOTE — Telephone Encounter (Signed)
 Called patient and scheduled initial B12 injection  for 8/14 and will schedule follow up injections at each visit.

## 2023-12-02 ENCOUNTER — Other Ambulatory Visit (HOSPITAL_BASED_OUTPATIENT_CLINIC_OR_DEPARTMENT_OTHER): Payer: Self-pay

## 2023-12-03 ENCOUNTER — Ambulatory Visit (INDEPENDENT_AMBULATORY_CARE_PROVIDER_SITE_OTHER)

## 2023-12-03 DIAGNOSIS — D510 Vitamin B12 deficiency anemia due to intrinsic factor deficiency: Secondary | ICD-10-CM

## 2023-12-03 LAB — HM MAMMOGRAPHY

## 2023-12-03 MED ORDER — CYANOCOBALAMIN 1000 MCG/ML IJ SOLN
1000.0000 ug | Freq: Once | INTRAMUSCULAR | Status: AC
  Administered 2023-12-03: 1000 ug via INTRAMUSCULAR

## 2023-12-03 NOTE — Progress Notes (Signed)
 Patient is in office today for a nurse visit for B12 Injection. Patient Injection was given in the  Left deltoid. Patient tolerated injection well.

## 2023-12-08 ENCOUNTER — Encounter: Payer: Self-pay | Admitting: Family Medicine

## 2023-12-08 ENCOUNTER — Other Ambulatory Visit (HOSPITAL_BASED_OUTPATIENT_CLINIC_OR_DEPARTMENT_OTHER): Payer: Self-pay

## 2023-12-10 ENCOUNTER — Ambulatory Visit (HOSPITAL_BASED_OUTPATIENT_CLINIC_OR_DEPARTMENT_OTHER)

## 2023-12-22 ENCOUNTER — Other Ambulatory Visit (HOSPITAL_BASED_OUTPATIENT_CLINIC_OR_DEPARTMENT_OTHER): Payer: Self-pay

## 2023-12-22 ENCOUNTER — Ambulatory Visit (HOSPITAL_BASED_OUTPATIENT_CLINIC_OR_DEPARTMENT_OTHER): Admitting: Family Medicine

## 2023-12-25 ENCOUNTER — Ambulatory Visit (HOSPITAL_BASED_OUTPATIENT_CLINIC_OR_DEPARTMENT_OTHER): Admitting: Family Medicine

## 2023-12-25 ENCOUNTER — Inpatient Hospital Stay: Attending: Oncology | Admitting: Oncology

## 2023-12-25 ENCOUNTER — Other Ambulatory Visit

## 2023-12-25 NOTE — Progress Notes (Deleted)
 [facility] [address] [place],  [place]  [address] [phone]  Clinic Day:  12/25/2023  Referring physician: Dottie Norleen PHEBE PONCE, MD   HISTORY OF PRESENT ILLNESS:  The patient is a 38 y.o. female  *** who I was asked to consult upon for *** A CT angiogram in December 2021 showed a left lower lobe pulmonary embolus.  Numerous CT angiograms since them have shown no evidence of   pulmonary emboli. PAST MEDICAL HISTORY:   Past Medical History:  Diagnosis Date   Anxiety    Asthma    Moderate   Bipolar 1 disorder (HCC)    f/by Psychiatry   History of pulmonary embolism    circa 2021, with reportedly negative coagulopathy workup   Hypertension    IBS (irritable bowel syndrome)    Insomnia    Morbid obesity (HCC)    OSA (obstructive sleep apnea)    with CPAP declined for now   Osteoarthritis    Spinal stenosis    f/by [facility] Neurosurgery    PAST SURGICAL HISTORY:   Past Surgical History:  Procedure Laterality Date   TOTAL HIP ARTHROPLASTY Left     CURRENT MEDICATIONS:   Current Outpatient Medications  Medication Sig Dispense Refill   albuterol (VENTOLIN HFA) 108 (90 Base) MCG/ACT inhaler Inhale 2 puffs into the lungs every 4 (four) hours as needed for wheezing.     amLODipine  (NORVASC ) 5 MG tablet Take 1 tablet (5 mg total) by mouth daily. 30 tablet 1   carvedilol (COREG) 12.5 MG tablet Take 12.5 mg by mouth 2 (two) times daily with a meal.     cyanocobalamin  1000 MCG tablet Take 1,000 mcg by mouth.     diphenhydrAMINE (BENADRYL) 25 mg capsule Take 25-50 mg by mouth.     ergocalciferol (VITAMIN D2) 1.25 MG (50000 UT) capsule Oral     fluticasone (FLONASE) 50 MCG/ACT nasal spray Place 2 sprays into the nose.     fluticasone (FLONASE) 50 MCG/ACT nasal spray 1 spray.     Fluticasone-Umeclidin-Vilant 100-62.5-25 MCG/ACT AEPB Inhale 1 puff into the lungs daily.     gabapentin (NEURONTIN) 300 MG capsule Take 300 mg by mouth 2 (two) times  daily.     hyoscyamine (LEVSIN SL) 0.125 MG SL tablet Place 0.125 mg under the tongue every 6 (six) hours as needed for cramping.     lithium 600 MG capsule Take 600 mg by mouth 2 (two) times daily with a meal.     montelukast (SINGULAIR) 10 MG tablet Take 10 mg by mouth at bedtime.     ondansetron  (ZOFRAN -ODT) 4 MG disintegrating tablet      predniSONE  (DELTASONE ) 20 MG tablet Take 20 mg, #2 pills (40 mg dose) daily x 3 days then 20 mg, #1 pill (20 mg dose) daily x 3 days. 9 tablet 0   promethazine (PHENERGAN) 25 MG tablet Take 25 mg by mouth.     QUEtiapine (SEROQUEL) 25 MG tablet Take 25 mg by mouth daily.     QUEtiapine (SEROQUEL) 50 MG tablet Take 50 mg by mouth daily.     rivaroxaban (XARELTO) 10 MG TABS tablet Take 10 mg by mouth.     triamterene-hydrochlorothiazide (MAXZIDE-25) 37.5-25 MG tablet Take 1 tablet by mouth.     No current facility-administered medications for this visit.    ALLERGIES:   Allergies  Allergen Reactions   Bee Venom Itching   Oxycodone Dermatitis and Itching    Severe itching  Hydrocodone-Acetaminophen Other (See Comments)    Other Reaction(s): Unknown   Ambien [Zolpidem] Hives   Morphine And Codeine Hives and Itching   Semaglutide     Nausea and diarrhea   Zolpidem Tartrate    Hydrocodone Itching and Rash   Wellbutrin [Bupropion] Hives and Rash    FAMILY HISTORY:   Family History  Problem Relation Age of Onset   Other Mother        Breast CA   Diabetes Father     SOCIAL HISTORY:   reports that she has never smoked. She has never used smokeless tobacco. She reports that she does not drink alcohol and does not use drugs.  REVIEW OF SYSTEMS:  Review of Systems - Oncology   PHYSICAL EXAM:  There were no vitals taken for this visit. Wt Readings from Last 3 Encounters:  11/20/23 (!) 323 lb 6.4 oz (146.7 kg)  11/11/19 (!) 280 lb (127 kg)  08/15/19 235 lb (106.6 kg)   There is no height or weight on file to calculate BMI. Performance  status (ECOG): {CHL ONC [id number] Physical Exam  LABS:      Latest Ref Rng & Units 11/20/2023   10:55 AM 10/23/2023   10:31 AM 08/15/2019    8:02 PM  CBC  WBC 3.4 - 10.8 x10E3/uL 5.1  4.2  6.9   Hemoglobin 11.1 - 15.9 g/dL 88.6  89.1  88.7   Hematocrit 34.0 - 46.6 % 36.6  35.7  33.5   Platelets 150 - 450 x10E3/uL 307  296  267       Latest Ref Rng & Units 11/20/2023   10:55 AM 10/23/2023   10:31 AM 08/15/2019    8:02 PM  CMP  Glucose 70 - 99 mg/dL 896  896  899   BUN 6 - 24 mg/dL 6  8  9    Creatinine 0.57 - 1.00 mg/dL 9.26  9.27  9.18   Sodium 134 - 144 mmol/L 141  141  139   Potassium 3.5 - 5.2 mmol/L 4.4  3.9  3.0   Chloride 96 - 106 mmol/L 105  104  104   CO2 20 - 29 mmol/L 21  22  26    Calcium 8.7 - 10.2 mg/dL 9.1  8.8  8.6   Total Protein 6.0 - 8.5 g/dL 6.8  7.0    Total Bilirubin 0.0 - 1.2 mg/dL 0.3  <9.7    Alkaline Phos 44 - 121 IU/L 77  103    AST 0 - 40 IU/L 7  10    ALT 0 - 32 IU/L 10  10       No results found for: CEA1, CEA / No results found for: CEA1, CEA No results found for: PSA1 No results found for: CAN199 No results found for: CAN125  No results found for: TOTALPROTELP, ALBUMINELP, A1GS, A2GS, BETS, BETA2SER, GAMS, MSPIKE, SPEI No results found for: TIBC, FERRITIN, IRONPCTSAT No results found for: LDH  No results found for: AFPTUMOR, TOTALPROTELP, ALBUMINELP, A1GS, A2GS, BETS, BETA2SER, GAMS, MSPIKE, SPEI, LDH, CEA1, CEA, PSA1, IGASERUM, IGGSERUM, IGMSERUM, THGAB, THYROGLB  Review Flowsheet        No data to display          STUDIES:  No results found.   ASSESSMENT & PLAN:  A 38 y.o. female who I was asked to consult upon for *** .The patient understands all the plans discussed today and is in agreement with them.  I do appreciate Dottie Norleen PHEBE PONCE,  MD for his new consult.   Shams Fill DELENA Kerns, MD

## 2024-01-01 ENCOUNTER — Other Ambulatory Visit (HOSPITAL_BASED_OUTPATIENT_CLINIC_OR_DEPARTMENT_OTHER): Payer: Self-pay | Admitting: Family Medicine

## 2024-01-01 ENCOUNTER — Other Ambulatory Visit (HOSPITAL_BASED_OUTPATIENT_CLINIC_OR_DEPARTMENT_OTHER): Payer: Self-pay

## 2024-01-01 DIAGNOSIS — R600 Localized edema: Secondary | ICD-10-CM

## 2024-01-01 DIAGNOSIS — R110 Nausea: Secondary | ICD-10-CM

## 2024-01-01 DIAGNOSIS — K589 Irritable bowel syndrome without diarrhea: Secondary | ICD-10-CM

## 2024-01-01 DIAGNOSIS — G609 Hereditary and idiopathic neuropathy, unspecified: Secondary | ICD-10-CM

## 2024-01-01 DIAGNOSIS — J45909 Unspecified asthma, uncomplicated: Secondary | ICD-10-CM

## 2024-01-01 DIAGNOSIS — I10 Essential (primary) hypertension: Secondary | ICD-10-CM

## 2024-01-01 MED ORDER — PROMETHAZINE HCL 25 MG PO TABS
25.0000 mg | ORAL_TABLET | Freq: Four times a day (QID) | ORAL | 0 refills | Status: DC | PRN
  Filled 2024-01-01: qty 10, 3d supply, fill #0

## 2024-01-01 MED ORDER — CARVEDILOL 12.5 MG PO TABS
12.5000 mg | ORAL_TABLET | Freq: Two times a day (BID) | ORAL | 0 refills | Status: DC
  Filled 2024-01-01: qty 90, 45d supply, fill #0

## 2024-01-01 MED ORDER — TRIAMTERENE-HCTZ 37.5-25 MG PO TABS
1.0000 | ORAL_TABLET | Freq: Every day | ORAL | 0 refills | Status: DC
  Filled 2024-01-01: qty 30, 30d supply, fill #0

## 2024-01-01 MED ORDER — HYOSCYAMINE SULFATE 0.125 MG SL SUBL
0.1250 mg | SUBLINGUAL_TABLET | Freq: Four times a day (QID) | SUBLINGUAL | 0 refills | Status: AC | PRN
  Filled 2024-01-01: qty 30, 8d supply, fill #0

## 2024-01-01 MED ORDER — GABAPENTIN 300 MG PO CAPS
300.0000 mg | ORAL_CAPSULE | Freq: Two times a day (BID) | ORAL | 0 refills | Status: DC
  Filled 2024-01-01: qty 60, 30d supply, fill #0

## 2024-01-01 MED ORDER — ALBUTEROL SULFATE HFA 108 (90 BASE) MCG/ACT IN AERS
2.0000 | INHALATION_SPRAY | RESPIRATORY_TRACT | 3 refills | Status: AC | PRN
  Filled 2024-01-01: qty 6.7, 25d supply, fill #0

## 2024-01-07 ENCOUNTER — Ambulatory Visit (HOSPITAL_BASED_OUTPATIENT_CLINIC_OR_DEPARTMENT_OTHER): Admitting: Family Medicine

## 2024-01-07 ENCOUNTER — Encounter (HOSPITAL_BASED_OUTPATIENT_CLINIC_OR_DEPARTMENT_OTHER): Payer: Self-pay | Admitting: Family Medicine

## 2024-01-07 VITALS — BP 143/80 | HR 63 | Temp 97.8°F | Resp 16 | Wt 308.2 lb

## 2024-01-07 DIAGNOSIS — D510 Vitamin B12 deficiency anemia due to intrinsic factor deficiency: Secondary | ICD-10-CM | POA: Insufficient documentation

## 2024-01-07 DIAGNOSIS — I10 Essential (primary) hypertension: Secondary | ICD-10-CM

## 2024-01-07 DIAGNOSIS — G4733 Obstructive sleep apnea (adult) (pediatric): Secondary | ICD-10-CM | POA: Diagnosis not present

## 2024-01-07 DIAGNOSIS — M1991 Primary osteoarthritis, unspecified site: Secondary | ICD-10-CM

## 2024-01-07 DIAGNOSIS — E785 Hyperlipidemia, unspecified: Secondary | ICD-10-CM | POA: Insufficient documentation

## 2024-01-07 MED ORDER — CYANOCOBALAMIN 1000 MCG/ML IJ SOLN
1000.0000 ug | Freq: Once | INTRAMUSCULAR | Status: AC
  Administered 2024-01-07: 1000 ug via INTRAMUSCULAR

## 2024-01-07 NOTE — Assessment & Plan Note (Addendum)
 Discomfort is controlled.  We are gradually getting caught up on her, but maintain relatively close follow up for now.

## 2024-01-07 NOTE — Progress Notes (Signed)
 Established Patient Office Visit  Subjective   Patient ID: Sharon Hill, female    DOB: [birth year]  Age: 38 y.o. MRN: [id number]  Chief Complaint  Patient presents with   Follow-up    Follow-up     F/u as above.  Please see previous notes for details.  She has lost over 20 lbs and is heartily commended.  Remains confident that her home BP is well controlled and accurate.  Extended discussion today about her plans for upcoming tests and specialists.  B12 replacement reviewed in some detail.  Extended discussion.    Past Medical History:  Diagnosis Date   Anxiety    Asthma    Moderate   Bipolar 1 disorder (HCC)    f/by Psychiatry   Contraception management    f/by GYN   History of pulmonary embolism    circa 2021, with Hematology referral pending to clarify length of anticoagulation.   Hypertension    IBS (irritable bowel syndrome)    Morbid obesity (HCC)    OSA (obstructive sleep apnea)    with reevaluation pending   Osteoarthritis    Spinal stenosis    f/by [facility]    Outpatient Encounter Medications as of 01/07/2024  Medication Sig   albuterol  (VENTOLIN  HFA) 108 (90 Base) MCG/ACT inhaler Inhale 2 puffs into the lungs every 4 (four) hours as needed for wheezing.   amLODipine  (NORVASC ) 5 MG tablet Take 1 tablet (5 mg total) by mouth daily.   carvedilol  (COREG ) 12.5 MG tablet Take 1 tablet (12.5 mg total) by mouth 2 (two) times daily with a meal.   cyanocobalamin  1000 MCG tablet Take 1,000 mcg by mouth.   diphenhydrAMINE (BENADRYL) 25 mg capsule Take 25-50 mg by mouth.   ergocalciferol (VITAMIN D2) 1.25 MG (50000 UT) capsule Oral   fluticasone (FLONASE) 50 MCG/ACT nasal spray Place 2 sprays into the nose.   Fluticasone-Umeclidin-Vilant 100-62.5-25 MCG/ACT AEPB Inhale 1 puff into the lungs daily.   gabapentin  (NEURONTIN ) 300 MG capsule Take 1 capsule (300 mg total) by mouth 2 (two) times daily.   hyoscyamine  (LEVSIN  SL) 0.125 MG SL tablet Place 1 tablet (0.125  mg total) under the tongue every 6 (six) hours as needed for cramping.   lithium 600 MG capsule Take 600 mg by mouth 2 (two) times daily with a meal.   montelukast (SINGULAIR) 10 MG tablet Take 10 mg by mouth at bedtime.   MOUNJARO 5 MG/0.5ML Pen SMARTSIG:5 Milligram(s) SUB-Q Once a Week   ondansetron  (ZOFRAN ) 4 MG tablet Take 4 mg by mouth every 6 (six) hours as needed.   predniSONE  (DELTASONE ) 20 MG tablet Take 20 mg, #2 pills (40 mg dose) daily x 3 days then 20 mg, #1 pill (20 mg dose) daily x 3 days.   QUEtiapine (SEROQUEL) 50 MG tablet Take 50 mg by mouth daily.   rivaroxaban (XARELTO) 10 MG TABS tablet Take 10 mg by mouth.   triamterene -hydrochlorothiazide  (MAXZIDE -25) 37.5-25 MG tablet Take 1 tablet by mouth daily.   [DISCONTINUED] fluticasone (FLONASE) 50 MCG/ACT nasal spray 1 spray.   [DISCONTINUED] MOUNJARO 2.5 MG/0.5ML Pen SMARTSIG:2.5 Milligram(s) SUB-Q Once a Week   [DISCONTINUED] ondansetron  (ZOFRAN -ODT) 4 MG disintegrating tablet    [DISCONTINUED] promethazine  (PHENERGAN ) 25 MG tablet Take 1 tablet (25 mg total) by mouth every 6 (six) hours as needed for nausea or vomiting.   [DISCONTINUED] QUEtiapine (SEROQUEL) 25 MG tablet Take 25 mg by mouth daily.   [DISCONTINUED] risperiDONE (RISPERDAL) 1 MG tablet Take 1 mg by  mouth 2 (two) times daily.   [EXPIRED] cyanocobalamin  (VITAMIN B12) injection 1,000 mcg    No facility-administered encounter medications on file as of 01/07/2024.    Social History   Tobacco Use   Smoking status: Never   Smokeless tobacco: Never  Vaping Use   Vaping status: Never Used  Substance Use Topics   Alcohol use: Never   Drug use: Never      Review of Systems  Constitutional:  Positive for weight loss. Negative for malaise/fatigue.  Cardiovascular:  Negative for chest pain and palpitations.  Neurological:  Negative for dizziness.  Psychiatric/Behavioral:  Negative for depression, hallucinations, memory loss, substance abuse and suicidal ideas.  The patient is not nervous/anxious and does not have insomnia.       Objective:     BP (!) 143/80   Pulse 63   Temp 97.8 F (36.6 C) (Oral)   Resp 16   Wt (!) 308 lb 3.2 oz (139.8 kg)   LMP 12/16/2023   SpO2 95%   BMI 47.25 kg/m    Physical Exam Constitutional:      General: She is not in acute distress.    Appearance: Normal appearance. She is obese.  HENT:     Head: Normocephalic.  Neck:     Vascular: No carotid bruit.  Cardiovascular:     Rate and Rhythm: Normal rate and regular rhythm.     Pulses: Normal pulses.     Heart sounds: Normal heart sounds.  Pulmonary:     Effort: Pulmonary effort is normal.     Breath sounds: Normal breath sounds.  Abdominal:     General: Bowel sounds are normal.     Palpations: Abdomen is soft.  Musculoskeletal:     Cervical back: Neck supple. No tenderness.     Right lower leg: No edema.     Left lower leg: No edema.  Neurological:     Mental Status: She is alert.      No results found for any visits on 01/07/24.    The 10-year ASCVD risk score (Arnett DK, et al., 2019) is: 7.9%    Assessment & Plan:  Primary hypertension Assessment & Plan: Fair control.  DASH diet and commended on weight loss!   Dyslipidemia Assessment & Plan: Low fat diet.   Pernicious anemia -     Cyanocobalamin   Obstructive sleep apnea syndrome in adult Assessment & Plan: Await reevaluation and CPAP titration prn.   Primary osteoarthritis, unspecified site Assessment & Plan: Discomfort is controlled.  We are gradually getting caught up on her, but maintain relatively close follow up for now.     Return in about 4 weeks (around 02/04/2024) for chronic follow-up.    REDDING PONCE NORLEEN FALCON., MD

## 2024-01-07 NOTE — Assessment & Plan Note (Signed)
 Fair control.  DASH diet and commended on weight loss!

## 2024-01-07 NOTE — Assessment & Plan Note (Signed)
 Low fat diet

## 2024-01-07 NOTE — Progress Notes (Signed)
 Patient is in office today for a nurse visit for B12 Injection. Patient Injection was given in the  Left deltoid. Patient tolerated injection well.

## 2024-01-07 NOTE — Assessment & Plan Note (Signed)
 Await reevaluation and CPAP titration prn.

## 2024-01-13 NOTE — Progress Notes (Signed)
 [facility] [address] [place],  [place]  [address] [phone]  Clinic Day:  01/17/2024  Referring physician: Dottie Norleen PHEBE PONCE, MD   HISTORY OF PRESENT ILLNESS:  The patient is a 38 y.o. female who I was asked to consult upon as a CT angiogram in December 2021 showed a left lower lobe pulmonary embolus.  The patient recalls waking up 1 morning 4 years ago short of breath.  She also recalls having progressive chest tightness.  As the symptoms progressed throughout the day, she went to the emergency room for further evaluation.  While there, D-dimer testing came back positive, which led to her receiving a CT angiogram.  This study showed the aforementioned pulmonary embolus in her left lower lobe.  This led to her being placed on Eliquis, for which she continues to take.  Of note, the patient claims that within a week of this diagnosis, she did have COVID testing come back positive.  The patient denies ever having a blood clot before this time.  She has also had CT angiograms since December 2021, which have not shown a recurrent pulmonary embolus.  This the patient denied having any significant travel history or bodily trauma that could have precipitated her pulmonary embolus in 2021.  To her knowledge, there is no family history of blood clots.  PAST MEDICAL HISTORY:   Past Medical History:  Diagnosis Date   Anxiety    Asthma    Moderate   Bipolar 1 disorder (HCC)    f/by Psychiatry   Contraception management    f/by GYN   History of pulmonary embolism    circa 2021, with Hematology referral pending to clarify length of anticoagulation.   Hypertension    IBS (irritable bowel syndrome)    Morbid obesity (HCC)    OSA (obstructive sleep apnea)    with reevaluation pending   Osteoarthritis    Spinal stenosis    f/by [facility]    PAST SURGICAL HISTORY:   Past Surgical History:  Procedure Laterality Date   HIP SURGERY Bilateral     SPINAL FUSION     L5-L6   TOE AMPUTATION Left    GREAT TOE   TOE OSTEOTOMY Left    SECOND AND THIRD TOE   TOTAL HIP ARTHROPLASTY Left    TUBAL LIGATION     WISDOM TOOTH EXTRACTION      CURRENT MEDICATIONS:   Current Outpatient Medications  Medication Sig Dispense Refill   albuterol  (VENTOLIN  HFA) 108 (90 Base) MCG/ACT inhaler Inhale 2 puffs into the lungs every 4 (four) hours as needed for wheezing. 6.7 g 3   amLODipine  (NORVASC ) 5 MG tablet Take 1 tablet (5 mg total) by mouth daily. 30 tablet 1   carvedilol  (COREG ) 12.5 MG tablet Take 1 tablet (12.5 mg total) by mouth 2 (two) times daily with a meal. 90 tablet 0   cyanocobalamin  1000 MCG tablet Take 1,000 mcg by mouth.     diphenhydrAMINE (BENADRYL) 25 mg capsule Take 25-50 mg by mouth.     ergocalciferol (VITAMIN D2) 1.25 MG (50000 UT) capsule Oral     fluticasone (FLONASE) 50 MCG/ACT nasal spray Place 2 sprays into the nose.     Fluticasone-Umeclidin-Vilant 100-62.5-25 MCG/ACT AEPB Inhale 1 puff into the lungs daily.     gabapentin  (NEURONTIN ) 300 MG capsule Take 1 capsule (300 mg total) by mouth 2 (two) times daily. 60 capsule 0   hyoscyamine  (LEVSIN  SL) 0.125 MG SL tablet  Place 1 tablet (0.125 mg total) under the tongue every 6 (six) hours as needed for cramping. 30 tablet 0   lithium 600 MG capsule Take 600 mg by mouth 2 (two) times daily with a meal.     montelukast (SINGULAIR) 10 MG tablet Take 10 mg by mouth at bedtime.     MOUNJARO 5 MG/0.5ML Pen SMARTSIG:5 Milligram(s) SUB-Q Once a Week     ondansetron  (ZOFRAN ) 4 MG tablet Take 4 mg by mouth every 6 (six) hours as needed.     predniSONE  (DELTASONE ) 20 MG tablet Take 20 mg, #2 pills (40 mg dose) daily x 3 days then 20 mg, #1 pill (20 mg dose) daily x 3 days. 9 tablet 0   QUEtiapine (SEROQUEL) 50 MG tablet Take 50 mg by mouth daily.     rivaroxaban (XARELTO) 10 MG TABS tablet Take 10 mg by mouth.     triamterene -hydrochlorothiazide  (MAXZIDE -25) 37.5-25 MG tablet Take 1 tablet  by mouth daily. 30 tablet 0   No current facility-administered medications for this visit.   ALLERGIES:   Allergies  Allergen Reactions   Bee Venom Itching   Oxycodone Dermatitis and Itching    Severe itching   Hydrocodone-Acetaminophen Other (See Comments)    Other Reaction(s): Unknown   Ambien [Zolpidem] Hives   Morphine And Codeine Hives and Itching   Semaglutide     Nausea and diarrhea   Zolpidem Tartrate    Hydrocodone Itching and Rash   Wellbutrin [Bupropion] Hives and Rash    FAMILY HISTORY:   Family History  Problem Relation Age of Onset   Breast cancer Mother    Diabetes Father    Prostate cancer Father    Hypertension Brother    Liver disease Brother    Brain cancer Maternal Aunt    Breast cancer Paternal Aunt     SOCIAL HISTORY:  The patient was born and raised in [place], [place] .  She currently lives in the [place] community.  She has been married for the last 8-1/2 years.  She has 2 children and 1 grandchild.  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  There is no history of alcoholism or tobacco abuse.  REVIEW OF SYSTEMS:  Review of Systems  Constitutional:  Negative for fatigue and fever.  HENT:   Negative for hearing loss and sore throat.   Eyes:  Positive for eye problems.  Respiratory:  Negative for chest tightness, cough and hemoptysis.   Cardiovascular:  Negative for chest pain and palpitations.  Gastrointestinal:  Negative for abdominal distention, abdominal pain, blood in stool, constipation, diarrhea, nausea and vomiting.  Endocrine: Negative for hot flashes.  Genitourinary:  Negative for difficulty urinating, dysuria, frequency, hematuria and nocturia.   Musculoskeletal:  Positive for arthralgias. Negative for back pain, gait problem and myalgias.  Skin: Negative.  Negative for itching and rash.  Neurological: Negative.  Negative for dizziness,  extremity weakness, gait problem, headaches, light-headedness and numbness.  Hematological: Negative.   Psychiatric/Behavioral:  Positive for depression. Negative for suicidal ideas. The patient is not nervous/anxious.      PHYSICAL EXAM:  Blood pressure (!) 152/78, pulse 63, temperature 98.5 F (36.9 C), temperature source Oral, resp. rate 16, height 5' 7 (1.702 m), weight (!) 309 lb (140.2 kg), last menstrual period 12/16/2023, SpO2 100%. Wt Readings from Last 3 Encounters:  01/14/24 (!) 309 lb (140.2 kg)  01/07/24 ROLLEN)  308 lb 3.2 oz (139.8 kg)  11/20/23 (!) 323 lb 6.4 oz (146.7 kg)   Body mass index is 48.4 kg/m. Performance status (ECOG): 0 - Asymptomatic Physical Exam Constitutional:      Appearance: Normal appearance. She is not ill-appearing.  HENT:     Mouth/Throat:     Mouth: Mucous membranes are moist.     Pharynx: Oropharynx is clear. No oropharyngeal exudate or posterior oropharyngeal erythema.  Cardiovascular:     Rate and Rhythm: Normal rate and regular rhythm.     Heart sounds: No murmur heard.    No friction rub. No gallop.  Pulmonary:     Effort: Pulmonary effort is normal. No respiratory distress.     Breath sounds: Normal breath sounds. No wheezing, rhonchi or rales.  Abdominal:     General: Bowel sounds are normal. There is no distension.     Palpations: Abdomen is soft. There is no mass.     Tenderness: There is no abdominal tenderness.  Musculoskeletal:        General: No swelling.     Right lower leg: No edema.     Left lower leg: No edema.  Lymphadenopathy:     Cervical: No cervical adenopathy.     Upper Body:     Right upper body: No supraclavicular or axillary adenopathy.     Left upper body: No supraclavicular or axillary adenopathy.     Lower Body: No right inguinal adenopathy. No left inguinal adenopathy.  Skin:    General: Skin is warm.     Coloration: Skin is not jaundiced.     Findings: No lesion or rash.  Neurological:     General: No  focal deficit present.     Mental Status: She is alert and oriented to person, place, and time. Mental status is at baseline.  Psychiatric:        Mood and Affect: Mood normal.        Behavior: Behavior normal.        Thought Content: Thought content normal.     LABS:      Latest Ref Rng & Units 11/20/2023   10:55 AM 10/23/2023   10:31 AM 08/15/2019    8:02 PM  CBC  WBC 3.4 - 10.8 x10E3/uL 5.1  4.2  6.9   Hemoglobin 11.1 - 15.9 g/dL 88.6  89.1  88.7   Hematocrit 34.0 - 46.6 % 36.6  35.7  33.5   Platelets 150 - 450 x10E3/uL 307  296  267       Latest Ref Rng & Units 11/20/2023   10:55 AM 10/23/2023   10:31 AM 08/15/2019    8:02 PM  CMP  Glucose 70 - 99 mg/dL 896  896  899   BUN 6 - 24 mg/dL 6  8  9    Creatinine 0.57 - 1.00 mg/dL 9.26  9.27  9.18   Sodium 134 - 144 mmol/L 141  141  139   Potassium 3.5 - 5.2 mmol/L 4.4  3.9  3.0   Chloride 96 - 106 mmol/L 105  104  104   CO2 20 - 29 mmol/L 21  22  26    Calcium 8.7 - 10.2 mg/dL 9.1  8.8  8.6   Total Protein 6.0 - 8.5 g/dL 6.8  7.0    Total Bilirubin 0.0 - 1.2 mg/dL 0.3  <9.7    Alkaline Phos 44 - 121 IU/L 77  103    AST 0 - 40 IU/L 7  10    ALT 0 - 32 IU/L 10  10      ASSESSMENT & PLAN:  A 38 y.o. female who I was asked to consult upon for a left lower lobe pulmonary embolus that was found in December 2021.  When reviewing her history, it very well could have been that she was infected with COVID at the time her pulmonary embolus was found.  It is well-known how COVID precipitates a hypercoagulable state that leads to blood clots forming.  The fact that she had no prior personal history or family history of blood clots has me to believe that she has no underlying hypercoagulable disorder that warrants extensive testing to be done.  Usually, for COVID-induced blood clots, anticoagulation is usually recommended for only 3-6 months.  As it has been nearly 4 years and the patient has not had any further blood clots, I do feel comfortable  with this patient having her anticoagulation discontinued.  The patient understands that if she was to develop an unprovoked future blood clot, it could relegate her to lifelong anticoagulation.  Otherwise, as the patient is doing well and has no other pressing hematologic issues, I do feel comfortable turning her care back over to her primary care office.  I would not have a problem seeing her in the future if new hematologic issues develop that require repeat clinical attention. The patient understands all the plans discussed today and is in agreement with them.  I do appreciate Sharon Norleen PHEBE PONCE, MD for his new consult.   Sharon Osias DELENA Kerns, MD

## 2024-01-14 ENCOUNTER — Inpatient Hospital Stay (HOSPITAL_BASED_OUTPATIENT_CLINIC_OR_DEPARTMENT_OTHER): Admitting: Oncology

## 2024-01-14 ENCOUNTER — Encounter: Payer: Self-pay | Admitting: Oncology

## 2024-01-14 VITALS — BP 152/78 | HR 63 | Temp 98.5°F | Resp 16 | Ht 67.0 in | Wt 309.0 lb

## 2024-01-14 DIAGNOSIS — I2699 Other pulmonary embolism without acute cor pulmonale: Secondary | ICD-10-CM

## 2024-01-14 DIAGNOSIS — Z803 Family history of malignant neoplasm of breast: Secondary | ICD-10-CM

## 2024-01-14 DIAGNOSIS — Z09 Encounter for follow-up examination after completed treatment for conditions other than malignant neoplasm: Secondary | ICD-10-CM

## 2024-01-14 DIAGNOSIS — Z800 Family history of malignant neoplasm of digestive organs: Secondary | ICD-10-CM | POA: Diagnosis not present

## 2024-01-14 DIAGNOSIS — Z86711 Personal history of pulmonary embolism: Secondary | ICD-10-CM

## 2024-01-14 DIAGNOSIS — Z8042 Family history of malignant neoplasm of prostate: Secondary | ICD-10-CM

## 2024-01-14 DIAGNOSIS — Z808 Family history of malignant neoplasm of other organs or systems: Secondary | ICD-10-CM

## 2024-01-26 ENCOUNTER — Encounter (HOSPITAL_BASED_OUTPATIENT_CLINIC_OR_DEPARTMENT_OTHER): Payer: Self-pay | Admitting: Family Medicine

## 2024-01-29 ENCOUNTER — Telehealth (HOSPITAL_BASED_OUTPATIENT_CLINIC_OR_DEPARTMENT_OTHER): Payer: Self-pay | Admitting: *Deleted

## 2024-02-01 ENCOUNTER — Ambulatory Visit: Payer: Self-pay

## 2024-02-01 NOTE — Telephone Encounter (Signed)
 FYI Only or Action Required?: FYI only for provider.  Patient was last seen in primary care on 01/07/2024 by Dottie Norleen PHEBE PONCE, MD.  Called Nurse Triage reporting Back Pain.  Symptoms began a week ago.  Interventions attempted: Prescription medications: Flomax, oxycodone.  Symptoms are: gradually worsening.  Triage Disposition: See Physician Within 24 Hours  Patient/caregiver understands and will follow disposition?: Yes Copied from CRM [id number]. Topic: Clinical - Red Word Triage >> Feb 01, 2024  1:48 PM Roselie BROCKS wrote: Red Word that prompted transfer to Nurse Triage: Patient was at E,R. Oct 6th . Had a hospital follow up scheduled for Oct 16 th,  had to reschedule. So turned it into a office visit.  Patient states she is having extreme lower back pain Reason for Disposition  [1] MODERATE pain (e.g., interferes with normal activities) AND [2] pain comes and goes AND [3] present > 24 hours  Answer Assessment - Initial Assessment Questions No OV availability within next 24 hours, advised UC. 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 Reports if she could not be evaluated by provider at her clinic, she would look into visiting another UC location. Scheduled soonest available appt for Wednesday with provider in case pt can not be seen at an UC given pts work schedule.  1. MAIN CONCERN OR SYMPTOM:  What is your main concern right now? What question do you have? What's the main symptom you're worried about? (e.g., blood in urine, flank pain)     Moderate-severe persistent flank pain since 10/7. Went to ED and was dx with kidney stones. Pt prescribed flomax and oxycodone. Reports mild temporary relief from the oxycodone, has not passed any stones yet. Pt seeking guidance on if current treatment is working or if further intervention is needed.  2. ONSET: When did the  flank pain  start?     10/7, went to ED and was diagnosed with kidney stone.   3. BETTER-SAME-WORSE: Are you  getting better, staying the same, or getting worse compared to how you felt at your last visit to the doctor (most recent medical visit)?     Same.  4. VISIT DATE: When were you seen? (Date)     10/7, went to the ED  5. VISIT DOCTOR: What is the name of the doctor (or NP/PA) taking care of you now?     Dr. Dottie is pts PCP, has not been seen by PCP for this issue. ED f/u appt scheduled for 10/28.  6. VISIT DIAGNOSIS:  What was the main symptom or problem that you were seen for? Were you given a diagnosis?      Kidney Stone  7. TREATMENT: Did you have any treatment for your kidney stone? (e.g., none, doctor exam, lithotripsy, medicines, stent) If Yes, ask: When did you have this treatment?     Prescribed flomax and oxycodone.  8. NEXT APPOINTMENT: Do you have a follow-up appointment with your doctor?     02/16/24   9. PAIN: Is there any pain? If Yes, ask: How bad is it?  (Scale 0-10; or none, mild, moderate, severe)      7-8/10.  10. FEVER: Do you have a fever? If Yes, ask: What is it, how was it measured, and when did it start?       No  11. OTHER SYMPTOMS: Do you have any other symptoms? (e.g., abdomen pain, blood in urine, vomiting)        Mild nausea. No blood in urine.  Has not passed any stones yet.  12. PREGNANCY: Is there any chance you are pregnant? When was your last menstrual period?       No. LMP 10/7. Went to the ED for flank pain pt thought was related to her period. Reports having a heavy flow since have tubal ligation in the past.  Protocols used: Kidney Stone Follow-up Call-A-AH

## 2024-02-02 NOTE — Telephone Encounter (Signed)
 LMOM with instructions to call back.

## 2024-02-03 ENCOUNTER — Encounter (HOSPITAL_BASED_OUTPATIENT_CLINIC_OR_DEPARTMENT_OTHER): Payer: Self-pay | Admitting: Student

## 2024-02-03 ENCOUNTER — Other Ambulatory Visit (HOSPITAL_BASED_OUTPATIENT_CLINIC_OR_DEPARTMENT_OTHER): Payer: Self-pay

## 2024-02-03 ENCOUNTER — Encounter (HOSPITAL_BASED_OUTPATIENT_CLINIC_OR_DEPARTMENT_OTHER): Payer: Self-pay

## 2024-02-03 ENCOUNTER — Ambulatory Visit (INDEPENDENT_AMBULATORY_CARE_PROVIDER_SITE_OTHER): Admitting: Student

## 2024-02-03 VITALS — BP 127/79 | HR 85 | Temp 98.2°F | Resp 16 | Ht 67.0 in | Wt 310.9 lb

## 2024-02-03 DIAGNOSIS — N200 Calculus of kidney: Secondary | ICD-10-CM

## 2024-02-03 DIAGNOSIS — R300 Dysuria: Secondary | ICD-10-CM

## 2024-02-03 LAB — POCT URINALYSIS DIP (CLINITEK)
Bilirubin, UA: NEGATIVE
Glucose, UA: NEGATIVE mg/dL
Ketones, POC UA: NEGATIVE mg/dL
Leukocytes, UA: NEGATIVE
Nitrite, UA: NEGATIVE
POC PROTEIN,UA: NEGATIVE
Spec Grav, UA: 1.02 (ref 1.010–1.025)
Urobilinogen, UA: 0.2 U/dL
pH, UA: 7 (ref 5.0–8.0)

## 2024-02-03 MED ORDER — TAMSULOSIN HCL 0.4 MG PO CAPS
0.4000 mg | ORAL_CAPSULE | Freq: Every day | ORAL | 0 refills | Status: AC
  Filled 2024-02-03: qty 20, 20d supply, fill #0

## 2024-02-03 MED ORDER — OXYCODONE HCL 5 MG PO TABS
5.0000 mg | ORAL_TABLET | Freq: Four times a day (QID) | ORAL | 0 refills | Status: AC | PRN
  Filled 2024-02-03: qty 10, 3d supply, fill #0

## 2024-02-03 NOTE — Patient Instructions (Signed)
 It was nice to see you today!  As we discussed in clinic:  If you have a kidney stone, you may experience pain in your back or side, blood in your urine, or the urge to urinate more often. Most kidney stones will pass on their own, but there are steps you can take to help the process and prevent future stones.  Drink plenty of fluids.  Aim for at least 2.5 to 3 liters (about 10-12 cups) of water each day. Staying well-hydrated helps flush out the stone and lowers your risk of getting more stones in the future.[2-7] Not all drinks are helpful--water is best, and you should limit sugary sodas and drinks with phosphoric acid.[1]   Manage your pain.  Passing a stone can be very painful. Over-the-counter pain medicines like ibuprofen can help, but talk to your doctor about what's best for you.[2] Sometimes, medicines called alpha blockers may be prescribed to help relax your urinary tract and make it easier for the stone to pass.[2][6]  Watch your diet. Eating habits can affect your risk of kidney stones. Try to:  Limit salt (sodium) and animal protein (like red meat).[1][3-5]  Eat enough calcium from foods (like dairy), but don't take extra calcium unless your doctor tells you to.[3][5]  Avoid foods high in oxalate, such as spinach, nuts, and chocolate, if you have calcium oxalate stones.[3][5]  Eat more fruits and vegetables, especially citrus fruits like lemons and oranges, which may help prevent stones.[5]  Stay active. Regular physical activity can help lower your risk of stones and support overall health.[7-8]  Follow up with your doctor.  If you have a fever, severe pain, trouble urinating, or vomiting, seek medical attention right away. Your doctor may recommend tests or treatments if the stone is too large to pass, causes infection, or blocks urine flow.[2]  Preventing future stones.  After you pass a stone, your doctor may check your urine and blood to find out what caused it. Following  the advice above and any specific recommendations from your doctor can help prevent stones from coming back.[1-6]  Remember, most kidney stones pass within a few weeks. Staying hydrated and making healthy lifestyle changes are the best ways to help your body and prevent future problems.  If you have any problems before your next visit feel free to message me via MyChart (minor issues or questions) or call the office, otherwise you may reach out to schedule an office visit.  Thank you! Aibhlinn Kalmar, PA-C

## 2024-02-03 NOTE — Progress Notes (Signed)
 Acute Office Visit  Subjective:     Patient ID: Sharon Hill, female    DOB: [birth year], 38 y.o.   MRN: [id number]  Chief Complaint  Patient presents with   Flank Pain    Was at [facility] Emergency Department on 01/26/2024. Dx with kidney stones. Was prescribed flomax & oxycodone. Having flank pain on right and radiates to back and pubic area. Has appt scheduled 02/16/2024 w/ Dr. Dottie.   Discussed the use of AI scribe software for clinical note transcription with the patient, who gave verbal consent to proceed.  History of Present Illness   Sharon Hill is a 38 year old female who presents with right-sided flank pain due to a kidney stone.  She initially visited [facility] ED on January 26, 2024, where initial imaging did not reveal any stones. However, a subsequent radiology overread identified a 2 mm non-obstructing kidney stone.  The pain radiates from the flank to the pubic area and across the back, with the most recent pain starting in the pubic area this morning. The pain was a 10 out of 10 when it first started, waking her from sleep, and is currently at a 7 to 8 out of 10.  She manages her symptoms with increased fluid intake, tamsulosin (Flomax), and oxycodone for pain relief. She has not noticed any blood in her urine, although she was on her menstrual cycle recently, which may have obscured any hematuria. A urine dipstick test at work showed no traceable blood.  She has never experienced a kidney stone or urinary tract infection before. She is concerned about the duration of the stone passing process, which could range from hours to weeks.  Pain radiates to the pubic area and across the back.      Review of Systems  Genitourinary:  Positive for flank pain.   Per HPI     Objective:    BP 127/79   Pulse 85   Temp 98.2 F (36.8 C) (Oral)   Resp 16   Ht 5' 7 (1.702 m)   Wt (!) 310 lb 14.4 oz (141 kg)   LMP 01/25/2024 (Approximate)   SpO2 98%   BMI 48.69 kg/m     Physical Exam Constitutional:      General: She is not in acute distress.    Appearance: Normal appearance. She is not ill-appearing.  HENT:     Head: Normocephalic and atraumatic.     Nose: Nose normal.  Eyes:     General: No scleral icterus.    Conjunctiva/sclera: Conjunctivae normal.  Cardiovascular:     Rate and Rhythm: Normal rate.  Abdominal:     Tenderness: There is right CVA tenderness. There is no left CVA tenderness.  Musculoskeletal:        General: Normal range of motion.  Skin:    General: Skin is warm and dry.     Coloration: Skin is not jaundiced or pale.  Neurological:     General: No focal deficit present.     Mental Status: She is alert.  Psychiatric:        Mood and Affect: Mood normal.        Behavior: Behavior normal.     Results for orders placed or performed in visit on 02/03/24  POCT URINALYSIS DIP (CLINITEK)  Result Value Ref Range   Color, UA yellow yellow   Clarity, UA clear clear   Glucose, UA negative negative mg/dL   Bilirubin, UA negative negative   Ketones, POC UA negative  negative mg/dL   Spec Grav, UA 8.979 8.989 - 1.025   Blood, UA trace-intact (A) negative   pH, UA 7.0 5.0 - 8.0   POC PROTEIN,UA negative negative, trace   Urobilinogen, UA 0.2 0.2 or 1.0 E.U./dL   Nitrite, UA Negative Negative   Leukocytes, UA Negative Negative        Assessment & Plan:   Assessment and Plan    Nephrolithiasis A 2mm kidney stone was identified on imaging from [facility] ED on 10/7. She is experiencing radiating flank pain, now localized to the pubic area, indicating stone movement down the ureter. Pain severity has decreased from 10/10 to 7-8/10. No visible hematuria, but a urine dipstick at work showed no traceable blood. There are no signs of obstruction or infection. The stone is likely in the lower third of the ureter, nearing the bladder for passage. This is her first occurrence of a kidney stone, and no dietary changes are recommended at  this time. Stones under 5mm should pass without significant issues unless congenital abnormalities are present. - Continue tamsulosin (Flomax) for stone passage facilitation- refill - Continue oxycodone for pain management as needed- small refill - Encourage increased fluid intake. - Perform urine dipstick test today to check for hematuria. - Consider adding lemon to water if stones become recurrent.      Return if symptoms worsen or fail to improve.  Shaletha Humble T Montia Haslip, PA-C

## 2024-02-04 ENCOUNTER — Ambulatory Visit (HOSPITAL_BASED_OUTPATIENT_CLINIC_OR_DEPARTMENT_OTHER): Admitting: Family Medicine

## 2024-02-16 ENCOUNTER — Encounter (HOSPITAL_BASED_OUTPATIENT_CLINIC_OR_DEPARTMENT_OTHER): Payer: Self-pay | Admitting: Family Medicine

## 2024-02-16 ENCOUNTER — Other Ambulatory Visit (HOSPITAL_BASED_OUTPATIENT_CLINIC_OR_DEPARTMENT_OTHER): Payer: Self-pay

## 2024-02-16 ENCOUNTER — Encounter (HOSPITAL_BASED_OUTPATIENT_CLINIC_OR_DEPARTMENT_OTHER): Payer: Self-pay

## 2024-02-16 ENCOUNTER — Ambulatory Visit (INDEPENDENT_AMBULATORY_CARE_PROVIDER_SITE_OTHER): Admitting: Family Medicine

## 2024-02-16 VITALS — BP 164/85 | HR 69 | Temp 98.0°F | Resp 16 | Wt 310.2 lb

## 2024-02-16 DIAGNOSIS — N200 Calculus of kidney: Secondary | ICD-10-CM | POA: Diagnosis not present

## 2024-02-16 DIAGNOSIS — I10 Essential (primary) hypertension: Secondary | ICD-10-CM

## 2024-02-16 DIAGNOSIS — R110 Nausea: Secondary | ICD-10-CM | POA: Insufficient documentation

## 2024-02-16 MED ORDER — PROMETHAZINE HCL 25 MG PO TABS
25.0000 mg | ORAL_TABLET | Freq: Three times a day (TID) | ORAL | 0 refills | Status: DC | PRN
  Filled 2024-02-16: qty 20, 7d supply, fill #0

## 2024-02-16 NOTE — Assessment & Plan Note (Signed)
 Continue efforts as above.  Continue Mounjaro for now.  Urged regular exercise.

## 2024-02-16 NOTE — Progress Notes (Signed)
 Established Patient Office Visit  Subjective   Patient ID: Sharon Hill, female    DOB: 28-Dec-1985  Age: 38 y.o. MRN: [id number]  Chief Complaint  Patient presents with   Follow-up    Follow-up     F/u as above.  She is trying to pass a kidney stone and has an appt with Urology at [facility] in December.  She would love to be seen sooner if possible.  Ongoing struggles with weight loss.  Occasional nausea, though its unclear if this is from her stone or from medication.    Past Medical History:  Diagnosis Date   Anxiety    Asthma    Moderate   Bipolar 1 disorder (HCC)    f/by Psychiatry   Contraception management    f/by GYN   History of pulmonary embolism    known to Dr. Ezzard   Hypertension    IBS (irritable bowel syndrome)    Morbid obesity (HCC)    OSA (obstructive sleep apnea)    with reevaluation pending   Osteoarthritis    Spinal stenosis    f/by [facility] Neurosurgery    Outpatient Encounter Medications as of 02/16/2024  Medication Sig   albuterol  (VENTOLIN  HFA) 108 (90 Base) MCG/ACT inhaler Inhale 2 puffs into the lungs every 4 (four) hours as needed for wheezing.   carvedilol  (COREG ) 12.5 MG tablet Take 1 tablet (12.5 mg total) by mouth 2 (two) times daily with a meal.   ergocalciferol (VITAMIN D2) 1.25 MG (50000 UT) capsule Oral   Fluticasone-Umeclidin-Vilant 100-62.5-25 MCG/ACT AEPB Inhale 1 puff into the lungs daily.   gabapentin  (NEURONTIN ) 300 MG capsule Take 1 capsule (300 mg total) by mouth 2 (two) times daily.   hyoscyamine  (LEVSIN  SL) 0.125 MG SL tablet Place 1 tablet (0.125 mg total) under the tongue every 6 (six) hours as needed for cramping.   lithium 600 MG capsule Take 600 mg by mouth 2 (two) times daily with a meal.   MOUNJARO 5 MG/0.5ML Pen SMARTSIG:5 Milligram(s) SUB-Q Once a Week   oxyCODONE (OXY IR/ROXICODONE) 5 MG immediate release tablet Take 1 tablet (5 mg total) by mouth every 6 (six) hours as needed for severe pain (pain score 7-10).    promethazine  (PHENERGAN ) 25 MG tablet Take 1 tablet (25 mg total) by mouth every 8 (eight) hours as needed for nausea or vomiting.   QUEtiapine (SEROQUEL) 50 MG tablet Take 50 mg by mouth daily.   tamsulosin (FLOMAX) 0.4 MG CAPS capsule Take 1 capsule (0.4 mg total) by mouth daily.   triamterene -hydrochlorothiazide  (MAXZIDE -25) 37.5-25 MG tablet Take 1 tablet by mouth daily.   [DISCONTINUED] promethazine  (PHENERGAN ) 25 MG suppository Place 25 mg rectally as needed for nausea or vomiting.   amLODipine  (NORVASC ) 5 MG tablet Take 1 tablet (5 mg total) by mouth daily. (Patient taking differently: Take 5 mg by mouth as needed (as needed).)   diphenhydrAMINE (BENADRYL) 25 mg capsule Take 25-50 mg by mouth. (Patient taking differently: Take 50 mg by mouth as needed.)   montelukast (SINGULAIR) 10 MG tablet Take 10 mg by mouth at bedtime. (Patient taking differently: Take 10 mg by mouth as needed.)   risperiDONE (RISPERDAL) 1 MG tablet TAKE 2 TABLETS DAILY AND 2 TABLETS AT BEDTIME Oral; Duration: 22 Days   rivaroxaban (XARELTO) 10 MG TABS tablet Take 10 mg by mouth. (Patient not taking: Reported on 02/03/2024)   tiZANidine (ZANAFLEX) 4 MG tablet TAKE 1 TABLET BY MOUTH EVERY 6 HOURS AS NEEDED Oral; Duration: 30 Days   [  DISCONTINUED] fluticasone (FLONASE) 50 MCG/ACT nasal spray Place 2 sprays into the nose. (Patient taking differently: Place 2 sprays into the nose as needed.)   No facility-administered encounter medications on file as of 02/16/2024.    Social History   Tobacco Use   Smoking status: Never    Passive exposure: Never   Smokeless tobacco: Never  Vaping Use   Vaping status: Never Used  Substance Use Topics   Alcohol use: Never   Drug use: Never      Review of Systems  Constitutional:  Negative for diaphoresis, fever, malaise/fatigue and weight loss.  Respiratory:  Negative for cough, shortness of breath and wheezing.   Cardiovascular:  Negative for chest pain, palpitations,  orthopnea, claudication, leg swelling and PND.      Objective:     BP (!) 164/85 (BP Location: Right Arm, Patient Position: Sitting, Cuff Size: Large)   Pulse 69   Temp 98 F (36.7 C) (Oral)   Resp 16   Wt (!) 310 lb 3.2 oz (140.7 kg)   LMP 01/25/2024 (Approximate)   SpO2 96%   BMI 48.58 kg/m    Physical Exam Constitutional:      General: She is not in acute distress.    Appearance: Normal appearance. She is obese.  HENT:     Head: Normocephalic.  Neck:     Vascular: No carotid bruit.  Cardiovascular:     Rate and Rhythm: Normal rate and regular rhythm.     Pulses: Normal pulses.     Heart sounds: Normal heart sounds.  Pulmonary:     Effort: Pulmonary effort is normal.     Breath sounds: Normal breath sounds.  Abdominal:     General: Bowel sounds are normal.     Palpations: Abdomen is soft.     Tenderness: There is no abdominal tenderness.  Musculoskeletal:     Cervical back: Neck supple. No tenderness.     Right lower leg: No edema.     Left lower leg: No edema.  Neurological:     Mental Status: She is alert.      No results found for any visits on 02/16/24.    The 10-year ASCVD risk score (Arnett DK, et al., 2019) is: 16.1%    Assessment & Plan:  Kidney stone -     Ambulatory referral to Urology  Nausea Assessment & Plan: Unclear etiology.  Bland diet.  If it doesn't resolve soon, she understands she needs to consider a trial off of the Mounjaro.    Orders: -     Promethazine  HCl; Take 1 tablet (25 mg total) by mouth every 8 (eight) hours as needed for nausea or vomiting.  Dispense: 20 tablet; Refill: 0  Primary hypertension Assessment & Plan: Likely related to anxiety today.  Advised a f/u Nursing BP  check in the coming week.  DASH diet.   Morbid obesity (HCC) Assessment & Plan: Continue efforts as above.  Continue Mounjaro for now.  Urged regular exercise.     Return in about 6 months (around 08/16/2024) for chronic follow-up.     REDDING PONCE NORLEEN FALCON., MD

## 2024-02-16 NOTE — Assessment & Plan Note (Signed)
 Unclear etiology.  Bland diet.  If it doesn't resolve soon, she understands she needs to consider a trial off of the Mounjaro.

## 2024-02-16 NOTE — Assessment & Plan Note (Signed)
 Likely related to anxiety today.  Advised a f/u Nursing BP  check in the coming week.  DASH diet.

## 2024-03-15 ENCOUNTER — Telehealth: Payer: Self-pay | Admitting: Genetic Counselor

## 2024-03-15 ENCOUNTER — Inpatient Hospital Stay

## 2024-05-05 ENCOUNTER — Inpatient Hospital Stay

## 2024-05-05 ENCOUNTER — Telehealth: Payer: Self-pay | Admitting: Oncology

## 2024-05-05 ENCOUNTER — Inpatient Hospital Stay: Attending: Genetic Counselor | Admitting: Genetic Counselor

## 2024-05-05 NOTE — Telephone Encounter (Signed)
 Good Morning, courteous notification of patient being No Show for Genetic Referral appointment scheduled for today @ 9:00am. Patient initial appt was 03/15/2024 and she canceled. Thank you and Take Care

## 2024-05-19 ENCOUNTER — Encounter (HOSPITAL_BASED_OUTPATIENT_CLINIC_OR_DEPARTMENT_OTHER): Payer: Self-pay | Admitting: Family Medicine

## 2024-05-19 ENCOUNTER — Other Ambulatory Visit (HOSPITAL_BASED_OUTPATIENT_CLINIC_OR_DEPARTMENT_OTHER): Payer: Self-pay | Admitting: Family Medicine

## 2024-05-19 ENCOUNTER — Telehealth (HOSPITAL_BASED_OUTPATIENT_CLINIC_OR_DEPARTMENT_OTHER): Payer: Self-pay | Admitting: *Deleted

## 2024-05-19 DIAGNOSIS — R600 Localized edema: Secondary | ICD-10-CM

## 2024-05-19 DIAGNOSIS — R110 Nausea: Secondary | ICD-10-CM

## 2024-05-19 DIAGNOSIS — I10 Essential (primary) hypertension: Secondary | ICD-10-CM

## 2024-05-19 DIAGNOSIS — G609 Hereditary and idiopathic neuropathy, unspecified: Secondary | ICD-10-CM

## 2024-05-20 ENCOUNTER — Ambulatory Visit (HOSPITAL_BASED_OUTPATIENT_CLINIC_OR_DEPARTMENT_OTHER)

## 2024-05-20 ENCOUNTER — Other Ambulatory Visit (HOSPITAL_BASED_OUTPATIENT_CLINIC_OR_DEPARTMENT_OTHER): Payer: Self-pay

## 2024-05-20 ENCOUNTER — Other Ambulatory Visit (HOSPITAL_BASED_OUTPATIENT_CLINIC_OR_DEPARTMENT_OTHER): Payer: Self-pay | Admitting: Family Medicine

## 2024-05-20 DIAGNOSIS — E785 Hyperlipidemia, unspecified: Secondary | ICD-10-CM

## 2024-05-20 DIAGNOSIS — G4733 Obstructive sleep apnea (adult) (pediatric): Secondary | ICD-10-CM

## 2024-05-20 DIAGNOSIS — R5383 Other fatigue: Secondary | ICD-10-CM

## 2024-05-20 LAB — CBC WITH DIFFERENTIAL/PLATELET
Basophils Absolute: 0 10*3/uL (ref 0.0–0.2)
Basos: 0 %
EOS (ABSOLUTE): 0.1 10*3/uL (ref 0.0–0.4)
Eos: 1 %
Hematocrit: 37.5 % (ref 34.0–46.6)
Hemoglobin: 12 g/dL (ref 11.1–15.9)
Immature Grans (Abs): 0 10*3/uL (ref 0.0–0.1)
Immature Granulocytes: 0 %
Lymphocytes Absolute: 1.2 10*3/uL (ref 0.7–3.1)
Lymphs: 23 %
MCH: 27.8 pg (ref 26.6–33.0)
MCHC: 32 g/dL (ref 31.5–35.7)
MCV: 87 fL (ref 79–97)
Monocytes Absolute: 0.2 10*3/uL (ref 0.1–0.9)
Monocytes: 4 %
Neutrophils Absolute: 3.8 10*3/uL (ref 1.4–7.0)
Neutrophils: 72 %
Platelets: 335 10*3/uL (ref 150–450)
RBC: 4.32 x10E6/uL (ref 3.77–5.28)
RDW: 14 % (ref 11.7–15.4)
WBC: 5.3 10*3/uL (ref 3.4–10.8)

## 2024-05-20 LAB — COMPREHENSIVE METABOLIC PANEL WITH GFR
ALT: 14 [IU]/L (ref 0–32)
AST: 14 [IU]/L (ref 0–40)
Albumin: 4 g/dL (ref 3.9–4.9)
Alkaline Phosphatase: 68 [IU]/L (ref 41–116)
BUN/Creatinine Ratio: 13 (ref 9–23)
BUN: 10 mg/dL (ref 6–24)
Bilirubin Total: 0.3 mg/dL (ref 0.0–1.2)
CO2: 22 mmol/L (ref 20–29)
Calcium: 9.1 mg/dL (ref 8.7–10.2)
Chloride: 103 mmol/L (ref 96–106)
Creatinine, Ser: 0.79 mg/dL (ref 0.57–1.00)
Globulin, Total: 2.7 g/dL (ref 1.5–4.5)
Glucose: 112 mg/dL — ABNORMAL HIGH (ref 70–99)
Potassium: 4.2 mmol/L (ref 3.5–5.2)
Sodium: 139 mmol/L (ref 134–144)
Total Protein: 6.7 g/dL (ref 6.0–8.5)
eGFR: 97 mL/min/{1.73_m2}

## 2024-05-20 LAB — TSH: TSH: 1.25 u[IU]/mL (ref 0.450–4.500)

## 2024-05-20 LAB — LIPID PANEL
Chol/HDL Ratio: 5.1 ratio — ABNORMAL HIGH (ref 0.0–4.4)
Cholesterol, Total: 185 mg/dL (ref 100–199)
HDL: 36 mg/dL — ABNORMAL LOW
LDL Chol Calc (NIH): 133 mg/dL — ABNORMAL HIGH (ref 0–99)
Triglycerides: 89 mg/dL (ref 0–149)
VLDL Cholesterol Cal: 16 mg/dL (ref 5–40)

## 2024-05-20 MED ORDER — CARVEDILOL 12.5 MG PO TABS
12.5000 mg | ORAL_TABLET | Freq: Two times a day (BID) | ORAL | 0 refills | Status: AC
  Filled 2024-05-20 (×2): qty 180, 90d supply, fill #0

## 2024-05-20 MED ORDER — TRIAMTERENE-HCTZ 37.5-25 MG PO TABS
1.0000 | ORAL_TABLET | Freq: Every day | ORAL | 0 refills | Status: AC
  Filled 2024-05-20: qty 30, 30d supply, fill #0

## 2024-05-20 MED ORDER — PROMETHAZINE HCL 25 MG PO TABS
25.0000 mg | ORAL_TABLET | Freq: Three times a day (TID) | ORAL | 0 refills | Status: AC | PRN
  Filled 2024-05-20: qty 10, 4d supply, fill #0

## 2024-05-20 MED ORDER — GABAPENTIN 300 MG PO CAPS
300.0000 mg | ORAL_CAPSULE | Freq: Two times a day (BID) | ORAL | 1 refills | Status: AC
  Filled 2024-05-20: qty 60, 30d supply, fill #0

## 2024-05-20 NOTE — Telephone Encounter (Signed)
 Pt. Made aware.

## 2024-05-21 ENCOUNTER — Ambulatory Visit (HOSPITAL_BASED_OUTPATIENT_CLINIC_OR_DEPARTMENT_OTHER): Payer: Self-pay | Admitting: Family Medicine

## 2024-05-24 ENCOUNTER — Telehealth (HOSPITAL_BASED_OUTPATIENT_CLINIC_OR_DEPARTMENT_OTHER): Payer: Self-pay | Admitting: *Deleted

## 2024-05-24 NOTE — Telephone Encounter (Signed)
 Pt made aware of form being faxed.

## 2024-06-02 ENCOUNTER — Ambulatory Visit (HOSPITAL_BASED_OUTPATIENT_CLINIC_OR_DEPARTMENT_OTHER): Admitting: Family Medicine

## 2024-06-07 ENCOUNTER — Ambulatory Visit (HOSPITAL_BASED_OUTPATIENT_CLINIC_OR_DEPARTMENT_OTHER)

## 2024-08-15 ENCOUNTER — Ambulatory Visit (HOSPITAL_BASED_OUTPATIENT_CLINIC_OR_DEPARTMENT_OTHER): Admitting: Pulmonary Disease
